# Patient Record
Sex: Male | Born: 1935
Health system: Southern US, Community
[De-identification: ages and names within clinical notes are randomized; demographics above are authoritative.]

## PROBLEM LIST (undated history)

## (undated) DIAGNOSIS — I2510 Atherosclerotic heart disease of native coronary artery without angina pectoris: Secondary | ICD-10-CM

## (undated) DIAGNOSIS — Z87898 Personal history of other specified conditions: Secondary | ICD-10-CM

## (undated) DIAGNOSIS — Z8679 Personal history of other diseases of the circulatory system: Secondary | ICD-10-CM

## (undated) DIAGNOSIS — R011 Cardiac murmur, unspecified: Secondary | ICD-10-CM

## (undated) DIAGNOSIS — I350 Nonrheumatic aortic (valve) stenosis: Secondary | ICD-10-CM

## (undated) DIAGNOSIS — N200 Calculus of kidney: Secondary | ICD-10-CM

## (undated) DIAGNOSIS — I10 Essential (primary) hypertension: Secondary | ICD-10-CM

## (undated) DIAGNOSIS — N189 Chronic kidney disease, unspecified: Secondary | ICD-10-CM

## (undated) DIAGNOSIS — N201 Calculus of ureter: Secondary | ICD-10-CM

## (undated) DIAGNOSIS — Z9289 Personal history of other medical treatment: Secondary | ICD-10-CM

## (undated) DIAGNOSIS — M069 Rheumatoid arthritis, unspecified: Secondary | ICD-10-CM

## (undated) DIAGNOSIS — Z8709 Personal history of other diseases of the respiratory system: Secondary | ICD-10-CM

## (undated) DIAGNOSIS — I6523 Occlusion and stenosis of bilateral carotid arteries: Secondary | ICD-10-CM

## (undated) DIAGNOSIS — E039 Hypothyroidism, unspecified: Secondary | ICD-10-CM

## (undated) DIAGNOSIS — I739 Peripheral vascular disease, unspecified: Secondary | ICD-10-CM

## (undated) HISTORY — PX: COLONOSCOPY: SHX174

## (undated) HISTORY — DX: Chronic kidney disease, unspecified: N18.9

## (undated) HISTORY — PX: TENDON REPAIR: SHX5111

## (undated) HISTORY — DX: Atherosclerotic heart disease of native coronary artery without angina pectoris: I25.10

## (undated) HISTORY — DX: Essential (primary) hypertension: I10

---

## 1971-03-27 HISTORY — PX: LUMBAR DISC SURGERY: SHX700

## 1983-03-27 HISTORY — PX: ANAL FISSURE REPAIR: SHX2312

## 1998-12-23 ENCOUNTER — Encounter: Payer: Self-pay | Admitting: Emergency Medicine

## 1998-12-23 ENCOUNTER — Emergency Department (HOSPITAL_COMMUNITY): Admission: EM | Admit: 1998-12-23 | Discharge: 1998-12-23 | Payer: Self-pay | Admitting: Emergency Medicine

## 2006-03-15 ENCOUNTER — Encounter (INDEPENDENT_AMBULATORY_CARE_PROVIDER_SITE_OTHER): Payer: Self-pay | Admitting: *Deleted

## 2006-03-15 ENCOUNTER — Ambulatory Visit (HOSPITAL_BASED_OUTPATIENT_CLINIC_OR_DEPARTMENT_OTHER): Admission: RE | Admit: 2006-03-15 | Discharge: 2006-03-15 | Payer: Self-pay | Admitting: Orthopedic Surgery

## 2006-03-15 HISTORY — PX: OTHER SURGICAL HISTORY: SHX169

## 2006-11-19 ENCOUNTER — Ambulatory Visit: Payer: Self-pay | Admitting: Cardiology

## 2006-11-19 ENCOUNTER — Inpatient Hospital Stay (HOSPITAL_COMMUNITY): Admission: EM | Admit: 2006-11-19 | Discharge: 2006-11-21 | Payer: Self-pay | Admitting: Emergency Medicine

## 2006-11-19 ENCOUNTER — Ambulatory Visit: Payer: Self-pay | Admitting: Pulmonary Disease

## 2006-11-29 ENCOUNTER — Ambulatory Visit: Payer: Self-pay

## 2006-11-29 ENCOUNTER — Encounter: Payer: Self-pay | Admitting: Cardiology

## 2007-01-08 ENCOUNTER — Ambulatory Visit: Payer: Self-pay | Admitting: Cardiology

## 2008-12-03 ENCOUNTER — Encounter: Payer: Self-pay | Admitting: Internal Medicine

## 2008-12-04 DIAGNOSIS — R55 Syncope and collapse: Secondary | ICD-10-CM | POA: Insufficient documentation

## 2008-12-04 DIAGNOSIS — I10 Essential (primary) hypertension: Secondary | ICD-10-CM

## 2008-12-04 DIAGNOSIS — J930 Spontaneous tension pneumothorax: Secondary | ICD-10-CM

## 2008-12-04 DIAGNOSIS — J939 Pneumothorax, unspecified: Secondary | ICD-10-CM | POA: Insufficient documentation

## 2008-12-06 ENCOUNTER — Encounter: Payer: Self-pay | Admitting: Internal Medicine

## 2008-12-07 ENCOUNTER — Encounter: Payer: Self-pay | Admitting: Internal Medicine

## 2008-12-16 ENCOUNTER — Ambulatory Visit: Payer: Self-pay | Admitting: Internal Medicine

## 2008-12-16 ENCOUNTER — Encounter (INDEPENDENT_AMBULATORY_CARE_PROVIDER_SITE_OTHER): Payer: Self-pay | Admitting: *Deleted

## 2008-12-16 DIAGNOSIS — R002 Palpitations: Secondary | ICD-10-CM | POA: Insufficient documentation

## 2010-05-18 ENCOUNTER — Ambulatory Visit (HOSPITAL_COMMUNITY)
Admission: RE | Admit: 2010-05-18 | Discharge: 2010-05-18 | Disposition: A | Discharge status: Home / Self Care | Payer: Medicare Other | Source: Ambulatory Visit | Attending: Orthopedic Surgery | Admitting: Orthopedic Surgery

## 2010-05-18 ENCOUNTER — Ambulatory Visit (HOSPITAL_BASED_OUTPATIENT_CLINIC_OR_DEPARTMENT_OTHER)
Admission: RE | Admit: 2010-05-18 | Discharge: 2010-05-19 | Disposition: A | Discharge status: Home / Self Care | Payer: Medicare Other | Source: Ambulatory Visit | Attending: Orthopedic Surgery | Admitting: Orthopedic Surgery

## 2010-05-18 DIAGNOSIS — M2240 Chondromalacia patellae, unspecified knee: Secondary | ICD-10-CM | POA: Insufficient documentation

## 2010-05-18 DIAGNOSIS — M23305 Other meniscus derangements, unspecified medial meniscus, unspecified knee: Secondary | ICD-10-CM | POA: Insufficient documentation

## 2010-05-18 HISTORY — PX: KNEE ARTHROSCOPY W/ MENISCECTOMY: SHX1879

## 2010-05-19 NOTE — Op Note (Signed)
  NAME:  Joseph Potts, Joseph Potts NO.:  [id number]  MEDICAL RECORD NO.:  [id number]           PATIENT TYPE:  O  LOCATION:  DAYL                         FACILITY:  [facility]  PHYSICIAN:  Marlowe Kays, M.D.  DATE OF BIRTH:  03-Feb-1933  DATE OF PROCEDURE:  05/18/2010 DATE OF DISCHARGE:  05/18/2010                              OPERATIVE REPORT   PREOPERATIVE DIAGNOSIS:  Torn medial meniscus, left knee.  POSTOPERATIVE DIAGNOSIS:  Torn medial meniscus, left knee.  OPERATION:  Left knee arthroscopy with partial medial meniscectomy.  SURGEON:  Marlowe Kays, M.D.  ASSISTANT:  Nurse.  ANESTHESIA:  General.  PATHOLOGICAL JUSTIFICATION FOR PROCEDURE:  Painful inner aspect of his left knee.  An MRI was performed, which demonstrated a badly-comminuted tear with fragments interposed in the joint.  This was exactly what we found at surgery.  He also had some grade 2/4 chondromalacia of the medial femoral condyle in the area where the meniscus was entrapped. The remainder of his joint looked relatively normal.  DESCRIPTION OF PROCEDURE:  Satisfactory general anesthesia, Ace wrap, and knee support to the right lower extremity, pneumatic tourniquet to left lower extremity with left leg Esmarched out non-sterilely and tourniquet inflated to 25 mmHg.  Thigh stabilizer applied.  Left leg was then prepped with DuraPrep and stabilized, ankle draped in sterile field.  Time-out performed.  Superior medial saline inflow.  First an anterolateral portal medial compartment joint was evaluated with the findings discussed in the pathology.  With small baskets, I began resecting the badly mangled fragment and then smoothed down the remaining rim with a 3.5 shaver.  Grade 2/4 chondromalacia of the medial femoral condyle required minimal shaving.  ACL was intact.  Looking at the medial gutter and suprapatellar area, he had some minimal wear of the patella, which did not require shaving.  I then  reversed portals. The lateral compartment knee joint looked entirely normal.  Final pictures were taken.  The knee joint was then irrigated of all material and fluid.  I then closed the two entry portals with 4-0 nylon and injected through the inflow apparatus 20 mL of 0.5% Marcaine with adrenaline and 4 mg of morphine, and then closed this portal with 4-0 nylon as well. Betadine, Adaptic dry sterile dressing were applied.  Tourniquet was released.  At the time of this dictation, he was on his way to recovery room in satisfactory condition with no known complications.          ______________________________ Marlowe Kays, M.D.     JA/MEDQ  D:  05/18/2010  T:  05/19/2010  Job:  [id number]  Electronically Signed by Marlowe Kays M.D. on 05/19/2010 04:26:59 PM

## 2010-08-08 NOTE — Discharge Summary (Signed)
NAME:  Joseph Potts, Joseph Potts NO.:  [id number]   MEDICAL RECORD NO.:  [id number]          PATIENT TYPE:  INP   LOCATION:  3739                         FACILITY:  [facility]   PHYSICIAN:  Coralyn Helling, MD        DATE OF BIRTH:  1932/10/31   DATE OF ADMISSION:  11/19/2006  DATE OF DISCHARGE:  11/21/2006                               DISCHARGE SUMMARY   DISCHARGE DIAGNOSES:  1. Right spontaneous pneumothorax, listed at 25% on admission.  2. Syncope of unknown origin.  3. Increased blood pressure.  4. Pain.   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  He was in his usual state  of good health.  He was involved in a typical exercise regimen on November 19, 2006.  He noticed during exercise that he started to become dizzy  and light-headedness and passed out.  His wife quickly woke him up.  There is no history of confusion after this event.  There was also no  history of loss of bowel or bladder control or significant injury.  He  states that, after the episode, he felt he had a discomfort in his chest  and was feeling short of breath.  For that reason, he was taken to the  [facility] Emergency Department for further evaluation and treatment  after being evaluated by Dr. Rudi Heap.  A chest x-ray at Dr. Kathi Der  office showed a 25% right pneumothorax.  He was transferred to [facility] and underwent right tube thoracostomy per Dr. Coralyn Helling.   LABORATORY DATA:  Hemoglobin 14.5, hematocrit 42.9, platelets of 184,  WBC was 13.3.  Sodium 137, potassium 5, chloride 101, CO2 30, BUN 13,  creatinine 0.76, glucose 101.  Albumin was 3.8, troponin I 0.2, CK-MB  was 1.7, CK was 155.  Calcium was 9.2, magnesium was 2.2, TSH was 1.322,  folate was greater than 20.  Vitamin B12 was 445.   RADIOGRAPHIC DATA:  Chest x-ray on August 26 demonstrates a right chest  tube in place and positional pneumothorax  was identified.  Chest x-ray  on August 28, after removal of chest tube, shows no pneumothorax.  CT of  the head on August 27 showed no acute abnormalities, within normal  limits for age.   HOSPITAL COURSE BY DISCHARGE DIAGNOSES:  1. Right spontaneous pneumothorax of 25%.  Chest x-ray, done at [facility] office, demonstrated a pneumothorax of 25%.  He had a right      tube thoracostomy, #28 [place], placed by Dr. Coralyn Helling on November 19, 2006, with reduction of the pneumothorax to zero.  He had the      chest tube removed on November 21, 2006.  Chest x-ray four hours      later demonstrated no pneumothorax and he was ready for discharge      home.  He has been instructed to follow up with the pulmonary      critical care division  at [facility] on September 2 for      evaluation of chest x-ray and removal of sutures.  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  He has alluded to the fact that he probably will      not make this appointment.  Therefore, it is suggested that, should      he decline to follow up on his appointment with pulmonary critical      care, that he go to Dr. Rudi Heap and have a PA and lateral      chest x-ray done to make sure that the pneumothorax has not      spontaneously recurred and also have the sutures removed.  He has      been instructed, should he have any problems, to call the pulmonary      critical care office for further evaluation and treatment.  2. Syncope:  His ECG was unremarkable.  CT scan was unremarkable.  His      cardiac enzymes were unremarkable.  He will follow up with Dr. Angelina Sheriff in one week for further evaluation and treatment that will      include a 2D echo.  3. Increased blood pressure:  Note that his blood pressure has trended      down and now is 136/72 with a heart rate of 53.  Again, this will      be evaluated by cardiology.  4.  Pain:  He was treated with IV analgesics and his pain has now      resolved and, once the chest tube removed, he had no further pain.   DISCHARGE MEDICATIONS:  He takes home vitamins and fish oil.  He is on  no prescription medications.   FOLLOWUP:  He has a followup appointment with Rubye Oaks, Nurse  Practitioner, on September 2, at 10:30 a.m.  He also has a followup  appointment with Dr. Antoine Poche as scheduled.   DIET:  Heart-healthy, low-sodium diet.   DISPOSITION/CONDITION ON DISCHARGE:  His 24% right pneumothorax has been  resolved with chest tube insertion and removal.  He is being discharged  home in improved condition.      Devra Dopp, MSN, ACNP      Coralyn Helling, MD  Electronically Signed    SM/MEDQ  D:  11/21/2006  T:  11/21/2006  Job:  [id number]   cc:   Rollene Rotunda, MD, [facility]  Ernestina Penna, M.D.

## 2010-08-08 NOTE — H&P (Signed)
NAME:  Joseph Potts, Joseph Potts NO.:  [id number]   MEDICAL RECORD NO.:  [id number]          PATIENT TYPE:  INP   LOCATION:  3702                         FACILITY:  [facility]   PHYSICIAN:  Joseph Helling, MD        DATE OF BIRTH:  1932-07-14   DATE OF ADMISSION:  11/19/2006  DATE OF DISCHARGE:                              HISTORY & PHYSICAL   ADMISSION DIAGNOSES:  1. Syncope.  2. Right pneumothorax.   Joseph Potts is a 77 year old male who has no significant past medical  history.  He was in his usual state of health. He was involved in his  typical exercise regimen earlier this morning. He said he had gone out  for a job, and then he came home and used his [employer] and was doing  exercises on that. He then had taken a shower and, while he was walking  in the kitchen, he says he started to become dizzy and lightheaded and  passed out. His wife heard this and immediately came to see him.  He  quickly woke up, and there is no history of confusion after this event.  There is also no history of loss of bowel or bladder control or  significant injury. He says that after this he felt that he had a  discomfort in his chest and was feeling short of breath.  He denies any  chest pressure or chest heaviness.  He said that it would hurt when he  would take a deep breath in.  He denies any palpitations, nausea,  vomiting or abdominal pain.  He also denies any recent fevers, sweats or  flu-like symptoms.  He has not been having any problems coughing.  He  was seen Joseph Potts's office early this morning. At that time he  had a chest x-ray done which showed approximately 25% right pneumothorax  but no evidence for rib fractures. EKG at that time also showed normal  sinus rhythm, and he was referred to [facility] emergency room for  further evaluation of syncope and his pneumothorax.   PAST MEDICAL HISTORY:  Unremarkable.   MEDICATIONS:  He takes several vitamins.   ALLERGIES:  He has an  allergy to IBUPROFEN which he says causes him to  develop swelling in his lips.   FAMILY HISTORY:  Is not significant.   SOCIAL HISTORY:  He is married.  He works as a Education officer, community.  There is no  history of tobacco abuse.  He has occasional alcohol use.   REVIEW OF SYSTEMS:  Unremarkable except for as stated above.   PHYSICAL EXAMINATION:  GENERAL:  He is seen in the emergency room.  He  is awake, alert and oriented. Did not appear to be in any acute  distress.  VITAL SIGNS:  Blood pressure is 154/87, heart rate 65 and regular,  respiratory rate  26, oxygen saturation  99% on 2 liters nasal cannula.  HEENT:  Pupils reactive.  Extraocular muscles intact.  He has a small  lump on the posterior aspect of his head.  There is no other signs of  trauma. Tongue is midline.  He has normal speech.  NECK:  There is no lymphadenopathy or jugular venous distention.  No  thyromegaly.  HEART:  S1-S2.  Regular rate and rhythm.  CHEST:  He had decreased breath sounds on the right. There is no  wheezing or rales.  ABDOMEN:  Thin, soft, nontender.  Positive bowel sounds.  EXTREMITIES:  There is no edema, cyanosis, clubbing.  NEUROLOGIC:  Cranial nerves II-XII are intact.  He has 5/5 strength and  normal sensation.   Chest x-ray as stated above.   Laboratory tests are pending.   EKG as stated above.   IMPRESSION:  1. Right-sided 25% pneumothorax. Given the patient's complaints of      dyspnea as well as the size of the pneumothorax, I believe that it      would be necessary to have chest tube drainage, and we will proceed      with chest tube insertion and follow up on his chest x-ray.  2. Syncope. I am not sure what the cause of this is.  I will monitor      him on telemetry.  I will follow up on his EKG as well as his      cardiac enzymes, and we will ask for further assistance from      cardiology.  3. Pain control.  I will continue him on oral and intravenous      analgesia as needed.  4.  Allergy to MOTRIN.      Joseph Helling, MD  Electronically Signed     VS/MEDQ  D:  11/19/2006  T:  11/19/2006  Job:  [id number]

## 2010-08-08 NOTE — Assessment & Plan Note (Signed)
[facility]                            CARDIOLOGY OFFICE NOTE   Shermaine, Brigham CHALMERS IDDINGS                      MRN:          [id number]  DATE:01/08/2007                            DOB:          [birth year]    PRIMARY:  Dr. Vernon Prey.   REASON FOR PRESENTATION:  Evaluate the patient for syncope.   HISTORY OF PRESENT ILLNESS:  The patient was hospitalized in August with  an episode of syncope.  He was also found to have a spontaneous  pneumothorax of 25% requiring a chest tube.  He was seen in consultation  by Dr. Juanda Chance.  The etiology of his syncope was felt to be probably  vagal related to the pneumothorax.  He did have an outpatient  echocardiogram, which was positive only for some mild aortic sclerosis.  He did rule out for myocardial infarction.   He has since been quite active.  He jogs.  He does other physical  activity and denies any chest discomfort, neck or arm discomfort.  He  has had no palpitations, pre-syncope, or syncope.  He has had no PND or  orthopnea.   PAST MEDICAL HISTORY:  Back surgery in 1973.  Anal fissure repair in  1985.  History of a decreased HDL.  Spontaneous pneumothorax as above.   ALLERGIES:  IBUPROFEN.   MEDICATIONS:  1. Fish oil 4000 mg daily.  2. Vitamin C.  3. Vitamin E.  4. Aloe vera.  5. Coenzyme Q.  6. L-Arginine.  7. Lycine.  8. Magnesium.   REVIEW OF SYSTEMS:  As stated in the HPI and otherwise negative for  other systems.   PHYSICAL EXAM:  The patient is in no distress.  Blood pressure 140/78, heart rate 64 and regular, weight 190 pounds,  body mass index 25.  HEENT:  Eyelids unremarkable.  Pupils are equal, round, and reactive to  light and accommodation.  Fundi are not visualized.  Oral mucosa  unremarkable.  NECK:  No jugular venous distension at 45 degrees, carotid upstroke  brisk and symmetric, no bruits, thyromegaly.  LYMPHATICS:  No cervical, axillary, or inguinal adenopathy.  LUNGS:  Clear  to auscultation bilaterally.  BACK:  No costovertebral angle tenderness.  CHEST:  Unremarkable.  HEART:  PMI not displaced or sustained, S1 and S2 within normal limits,  no S3, no S4, very slight systolic murmur heard right at the right upper  sternal border.  No diastolic murmurs.  ABDOMEN:  Flat, positive bowel sounds, normal in frequency and pitch, no  bruits, rebound, guarding.  No midline pulsatile masses, hepatomegaly,  splenomegaly.  SKIN:  No rashes, no nodules.  EXTREMITIES:  With 2+ pulses, no edema.  NEURO:  Oriented to person, place, and time, cranial nerves 2-12 grossly  intact, motor grossly intact.   ASSESSMENT AND PLAN:  1. Syncope.  This is very likely related to the pneumothorax.  He has      otherwise done quite well and has no cardiovascular symptoms.  At      this point, no further cardiovascular testing is suggested.  2. Hypertension.  His blood pressure  is borderline.  He will have this      followed by Dr. Christell Constant going forward.  3. Followup will be back in this clinic as needed.     Rollene Rotunda, MD, [facility]  Electronically Signed    JH/MedQ  DD: 01/08/2007  DT: 01/09/2007  Job #: [id number]   cc:   Ernestina Penna, M.D.

## 2010-08-08 NOTE — Consult Note (Signed)
NAME:  Joseph Potts, Joseph Potts NO.:  [id number]   MEDICAL RECORD NO.:  [id number]          PATIENT TYPE:  INP   LOCATION:  3702                         FACILITY:  [facility]   PHYSICIAN:  Everardo Beals. Juanda Chance, MD, FACCDATE OF BIRTH:  Jan 31, 1933   DATE OF CONSULTATION:  DATE OF DISCHARGE:                                 CONSULTATION   PRIMARY CARE PHYSICIAN:  Dr. Vernon Prey.   CARDIOLOGIST:  Dr. Angelina Sheriff.   PULMONOLOGIST:  Dr. Coralyn Helling.   REASON FOR CONSULTATION:  Evaluation of syncope.   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  He is very active and jogs  regularly and works out with weight.  He jogged and works out with  weights this morning and then took a shower.  After he got out of the  shower, he became real lightheaded and then fell to the floor.  His wife  was in the next room, heard the thump and came in to see him.  She found  him on the floor and shook him, and opened his eyes and responded.  Shortly after that, he developed some mild right-sided chest pain with  inspiration.  He is worried this might be cardiac and went to Dr.  Kathi Der office.  Dr. Christell Constant did a chest x-ray which showed a right  pneumothorax and called Dr. Daleen Squibb and referred him to [facility]  Emergency Department.  He was seen in the emergency room by Dr. Coralyn Helling, and a and chest tube was placed, and we were asked to consult him  regarding his syncope.   Dr. Melvyn Neth had been evaluated four years ago by Dr. Antoine Poche, with a  stress Myoview scan which showed some ST changes and some questionable  slight inferior attenuation, with possible mild ischemia.  This was felt  to be low risk, and he did not have any further evaluation.  He has had  no recent chest pain, shortness of breath or palpitations.  He has had a  remote history of palpitations.  He has had no previous syncope.   PAST MEDICAL HISTORY:  Significant for  back surgery in 1973.  He had  anal fissure repair in 1985.  Also, has a history of decreased HDL.   MEDICATIONS:  None.   ALLERGIES:  IBUPROFEN.   SOCIAL HISTORY:  Lives in [place] with his wife.  He has five children,  does not smoke.  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   FAMILY HISTORY:  Negative for cardiac disease.  His mother died at 21 of  stroke, and his father died at 43 of a stroke.   REVIEW OF SYSTEMS:  Is negative.   PHYSICAL EXAMINATION:  VITAL SIGNS:  Blood pressure 147/74 and the pulse  58 and regular.  NECK:  There was no venous distention.  The carotid pulses were full,  and there were no bruits.  CHEST:  Clear.  The chest had decreased breath sounds on the right side,  prior to chest tube placement.  The left side was clear without rales or  rhonchi.  CARDIAC:  Rhythm was regular.  Heart sounds were normoactive.  No  murmurs or gallops.  ABDOMEN:  Soft with normal bowel sounds.  There is no  hepatosplenomegaly.  Peripheral pulses were full.  There is no  peripheral edema.  MUSCULOSKELETAL:  Exam showed no deformities.  SKIN:  Warm and dry.  NEUROLOGIC:  Examination showed no focal neurological signs.   IMPRESSION:  1. Syncope of uncertain etiology.  2. Right pneumothorax status post chest tube placement.  3. Low HDL.   RECOMMENDATIONS:  I think most probably, Joseph Potts syncope was a  vasovagal syncope, related to post-exercise vaso-dilatation and  showering.  Concerning feature is that he has done this before, without  similar symptoms.  Will plan to watch him on the monitor.  Will plan to  get a 2-D echo on him, today or tomorrow.  If all his markers are  negative and we do not see any arrhythmias on monitoring and if echo is  normal, we will plan an outpatient stress test and Myoview scan.  Anticipation is that he will need to be in two days with his chest tube.      Bruce Elvera Lennox Juanda Chance, MD, [facility]  Electronically  Signed     BRB/MEDQ  D:  11/19/2006  T:  11/20/2006  Job:  [id number]

## 2010-08-08 NOTE — Op Note (Signed)
NAME:  Joseph Potts, DICIOCCIO NO.:  [id number]   MEDICAL RECORD NO.:  [id number]          PATIENT TYPE:  EMS   LOCATION:  MAJO                         FACILITY:  [facility]   PHYSICIAN:  Coralyn Helling, MD        DATE OF BIRTH:  02/24/1933   DATE OF PROCEDURE:  11/19/2006  DATE OF DISCHARGE:                               OPERATIVE REPORT   PROCEDURE:  Right chest tube thoracotomy.   PREOPERATIVE DIAGNOSIS:  Right sided pneumothorax.   POSTOPERATIVE DIAGNOSIS:  Right sided pneumothorax.   INDICATIONS:  [name] is a 77 year old male who had an episode of  syncope earlier in the day and he sustained a 25% right pneumothorax.  He is to undergo chest tube insertion for drainage of the right  pneumothorax.  The procedure was explained to the patient.   DESCRIPTION OF PROCEDURE:  The procedure was done in the emergency room.  He was given a total of 2 mg of Versed and 50 mcg of fentanyl  intravenously for sedation and analgesia.  An incision was made at the  right fourth rib space in the mid axillary line after receiving 1%  lidocaine for topical anesthesia.  Blunt dissection was done down to the  pleural space, then the pleural space was punctured.  There was no  evidence of adhesions on finger sweep in the rib cage.  A 20 French  chest tube was inserted with good conization of the tubing and the chest  tube was connected to a Pleur-Evac with initial air leak which resolved.  The patient remained hemodynamically stable throughout the procedure.  Post procedure chest x-ray showed the chest tube in good position with  re-expansion of the right lung.      Coralyn Helling, MD  Electronically Signed     VS/MEDQ  D:  11/19/2006  T:  11/19/2006  Job:  [id number]

## 2010-08-11 NOTE — Op Note (Signed)
NAME:  [name], [name] NO.:  [id number]   MEDICAL RECORD NO.:  [id number]          PATIENT TYPE:  AMB   LOCATION:  DSC                          FACILITY:  [facility]   PHYSICIAN:  Katy Fitch. Sypher, M.D. DATE OF BIRTH:  09-24-1932   DATE OF PROCEDURE:  03/15/2006  DATE OF DISCHARGE:                               OPERATIVE REPORT   PREOPERATIVE DIAGNOSIS:  Enlarging skin lesion right long ring web space  now measuring approximately 6 x 8 mm.   POSTOPERATIVE DIAGNOSIS:  Enlarging skin lesion right long ring web  space now measuring approximately 6 x 8 mm pending histopathologic  evaluation of biopsy specimen.   OPERATION:  Full thickness skin resection, right long ring finger dorsal  web.   SURGEON:  Katy Fitch. Sypher, M.D.   ASSISTANT:  Marveen Reeks. Dasnoit, P.A.-C.   ANESTHESIA:  2% lidocaine local block of right long ring finger web.   ANESTHETIST:  Dr. 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  He had noted enlargement of an area of friable skin over the  course of approximately six months.  He was concerned about the  appearance of the mass and requested a full thickness skin biopsy.  The  differential diagnosis includes dermatofibroma or perhaps, a well  differentiated squamous cell carcinoma.  After informed consent, he is  brought to the operating room at this time anticipating full thickness  skin biopsy.   PROCEDURE:  Kais Monje is brought to the operating room and placed in  the supine position on the operating table.  Following routine Betadine  prep, a 2% lidocaine block was placed between the long and ring finger  web space.  The arm was then prepped with Betadine soap solution and  sterilely draped.  A pneumatic tourniquet was applied to the proximal  right forearm.  After exsanguination of the right hand, wrist and  forearm, the  arterial tourniquet was inflated to 280 mmHg.  The  procedure commenced with an elliptical excision of skin with a 1 mm  margin.  The biopsy specimen was approximately 1 cm in length and  approximately 8 mm in width.  This was taken full thickness down to the  subcutaneous fat.  Care was taken to spare the sensory nerves and veins.  The wound was then repaired with an intradermal 4-0 Prolene suture.  The  web was dressed with Xeroflow sterile gauze and a Coban dressing.   I have advised Dr. Melvyn Neth to remove his dressing in 48 hours.  At that  time, he may dress the wound with Neosporin.  He will return for follow-  up in one week.  He understands he should keep his wound clean and dry  for seven days.  For aftercare, he will use over-the-counter analgesic  medication.  No prophylactic antibiotics were provided.  He will contact  us should he have any difficulties with his wound.      Katy Fitch Sypher, M.D.  Electronically Signed     RVS/MEDQ  D:  03/15/2006  T:  03/16/2006  Job:  [id number]   cc:   Ernestina Penna, M.D.

## 2011-01-05 LAB — COMPREHENSIVE METABOLIC PANEL
ALT: 19
AST: 22
Alkaline Phosphatase: 54
CO2: 30
Calcium: 9.2
Chloride: 101
GFR calc non Af Amer: >60
Glucose, Bld: 101 — ABNORMAL HIGH
Potassium: 5
Sodium: 137

## 2011-01-05 LAB — URINALYSIS, ROUTINE W REFLEX MICROSCOPIC
Ketones, ur: 15 — AB
Nitrite: NEGATIVE
Protein, ur: NEGATIVE
pH: 7

## 2011-01-05 LAB — CARDIAC PANEL(CRET KIN+CKTOT+MB+TROPI)
CK, MB: 1.7
Relative Index: 1.1

## 2011-01-05 LAB — CK TOTAL AND CKMB (NOT AT ARMC)
CK, MB: 2.1
Total CK: 80

## 2011-01-05 LAB — MAGNESIUM: Magnesium: 2.2

## 2011-01-05 LAB — VITAMIN B12: Vitamin B-12: 445 (ref 211–911)

## 2011-01-05 LAB — CBC
Platelets: 184
RDW: 12.9

## 2011-01-05 LAB — DIFFERENTIAL
Basophils Relative: 0
Eosinophils Absolute: 0.1
Eosinophils Relative: 1
Lymphs Abs: 1.2

## 2011-01-05 LAB — TSH: TSH: 1.322

## 2011-01-05 LAB — TROPONIN I: Troponin I: 0.03

## 2011-05-04 DIAGNOSIS — I781 Nevus, non-neoplastic: Secondary | ICD-10-CM | POA: Diagnosis not present

## 2011-05-04 DIAGNOSIS — L819 Disorder of pigmentation, unspecified: Secondary | ICD-10-CM | POA: Diagnosis not present

## 2011-05-04 DIAGNOSIS — L570 Actinic keratosis: Secondary | ICD-10-CM | POA: Diagnosis not present

## 2011-06-15 DIAGNOSIS — L819 Disorder of pigmentation, unspecified: Secondary | ICD-10-CM | POA: Diagnosis not present

## 2011-09-04 ENCOUNTER — Encounter: Payer: Self-pay | Admitting: Cardiovascular Disease

## 2011-09-04 ENCOUNTER — Ambulatory Visit (INDEPENDENT_AMBULATORY_CARE_PROVIDER_SITE_OTHER): Payer: Medicare Other | Admitting: Cardiovascular Disease

## 2011-09-04 VITALS — BP 110/64 | HR 58 | Ht 72.0 in | Wt 187.4 lb

## 2011-09-04 DIAGNOSIS — E039 Hypothyroidism, unspecified: Secondary | ICD-10-CM | POA: Diagnosis not present

## 2011-09-04 DIAGNOSIS — E559 Vitamin D deficiency, unspecified: Secondary | ICD-10-CM | POA: Diagnosis not present

## 2011-09-04 DIAGNOSIS — I10 Essential (primary) hypertension: Secondary | ICD-10-CM | POA: Diagnosis not present

## 2011-09-04 DIAGNOSIS — Z125 Encounter for screening for malignant neoplasm of prostate: Secondary | ICD-10-CM | POA: Diagnosis not present

## 2011-09-04 DIAGNOSIS — R55 Syncope and collapse: Secondary | ICD-10-CM

## 2011-09-04 DIAGNOSIS — E291 Testicular hypofunction: Secondary | ICD-10-CM | POA: Diagnosis not present

## 2011-09-04 DIAGNOSIS — E785 Hyperlipidemia, unspecified: Secondary | ICD-10-CM | POA: Diagnosis not present

## 2011-09-04 NOTE — Patient Instructions (Signed)
Your physician recommends that you schedule a follow-up appointment in: 3 weeks with Dr. Antoine Poche in [facility]  Your physician has requested that you have an echocardiogram. Echocardiography is a painless test that uses sound waves to create images of your heart. It provides your doctor with information about the size and shape of your heart and how well your heart's chambers and valves are working. This procedure takes approximately one hour. There are no restrictions for this procedure.   Your physician has requested that you have a carotid duplex. This test is an ultrasound of the carotid arteries in your neck. It looks at blood flow through these arteries that supply the brain with blood. Allow one hour for this exam. There are no restrictions or special instructions.

## 2011-09-04 NOTE — Assessment & Plan Note (Signed)
Episode of syncope after extreme exertion while walking 18 holes of golf in the heat. Most likely vasovagal or secondary to dehydration. No recurrence. Will arrange echo to assess LV function and carotid dopplers to exclude carotid stenosis. I do not think this is related to ischemia. He had no palpitations so likely not related to an arrythmia.  He will follow up with Dr. Antoine Poche in [place].
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
## 2011-09-05 ENCOUNTER — Encounter (INDEPENDENT_AMBULATORY_CARE_PROVIDER_SITE_OTHER): Payer: Medicare Other

## 2011-09-05 DIAGNOSIS — I6529 Occlusion and stenosis of unspecified carotid artery: Secondary | ICD-10-CM

## 2011-09-05 DIAGNOSIS — R55 Syncope and collapse: Secondary | ICD-10-CM | POA: Diagnosis not present

## 2011-09-06 ENCOUNTER — Ambulatory Visit (HOSPITAL_COMMUNITY): Payer: Medicare Other | Attending: Cardiology

## 2011-09-06 DIAGNOSIS — R55 Syncope and collapse: Secondary | ICD-10-CM | POA: Diagnosis not present

## 2011-09-06 DIAGNOSIS — I359 Nonrheumatic aortic valve disorder, unspecified: Secondary | ICD-10-CM | POA: Insufficient documentation

## 2011-09-06 DIAGNOSIS — I4729 Other ventricular tachycardia: Secondary | ICD-10-CM | POA: Insufficient documentation

## 2011-09-06 DIAGNOSIS — I472 Ventricular tachycardia, unspecified: Secondary | ICD-10-CM | POA: Insufficient documentation

## 2011-09-06 HISTORY — PX: TRANSTHORACIC ECHOCARDIOGRAM: SHX275

## 2011-09-06 NOTE — Progress Notes (Signed)
Echocardiogram performed.  

## 2011-09-07 DIAGNOSIS — Z1212 Encounter for screening for malignant neoplasm of rectum: Secondary | ICD-10-CM | POA: Diagnosis not present

## 2011-09-24 ENCOUNTER — Telehealth: Payer: Self-pay | Admitting: *Deleted

## 2011-09-26 ENCOUNTER — Ambulatory Visit (INDEPENDENT_AMBULATORY_CARE_PROVIDER_SITE_OTHER): Payer: Medicare Other | Admitting: Cardiology

## 2011-09-26 ENCOUNTER — Encounter: Payer: Self-pay | Admitting: Cardiology

## 2011-09-26 VITALS — BP 140/70 | HR 61 | Ht 72.0 in | Wt 191.0 lb

## 2011-09-26 DIAGNOSIS — R55 Syncope and collapse: Secondary | ICD-10-CM

## 2011-09-26 DIAGNOSIS — R609 Edema, unspecified: Secondary | ICD-10-CM | POA: Insufficient documentation

## 2011-09-26 DIAGNOSIS — I10 Essential (primary) hypertension: Secondary | ICD-10-CM

## 2011-09-26 NOTE — Assessment & Plan Note (Signed)
He does have edema and I suggested compression stockings when he is at work on his feet.

## 2011-09-26 NOTE — Assessment & Plan Note (Signed)
The blood pressure is at target. No change in medications is indicated. We will continue with therapeutic lifestyle changes (TLC).  

## 2011-09-26 NOTE — Patient Instructions (Addendum)
The current medical regimen is effective;  continue present plan and medications.  Follow up as needed 

## 2011-09-26 NOTE — Assessment & Plan Note (Signed)
This is probably related to the circumstances of being overheated and under hydrated. No further workup is suggested. Let us know if this happens again.

## 2011-09-26 NOTE — Progress Notes (Signed)
   HPI The patient presents for followup of syncope. This happened as described by Dr. Tempie Donning.  Since that event she's had no further episodes. He denies any palpitations, presyncope or syncope.  He walks routinely and he has no chest pain or SOB.  He did have a carotid with bilateral non obstructive stenosis.  Echo demonstrated a well preserved EF with mild LVH.    Allergies  Allergen Reactions  . Ibuprofen     Current Outpatient Prescriptions  Medication Sig Dispense Refill  . Cholecalciferol (VITAMIN D3) 5000 UNITS TABS Take 5,000 Units by mouth daily.      Marland Kitchen co-enzyme Q-10 30 MG capsule Take 30 mg by mouth daily.      . fish oil-omega-3 fatty acids 1000 MG capsule Take 4 g by mouth daily.      Marland Kitchen L-Arginine 1000 MG TABS Take 1,000 mg by mouth daily.      . magnesium gluconate (MAGONATE) 500 MG tablet Take 500 mg by mouth daily.      . niacinamide 500 MG tablet Take 500 mg by mouth daily.      Marland Kitchen selenium 50 MCG TABS Take 200 mcg by mouth daily.      Marland Kitchen VIT B6-VIT B12-OMEGA 3 ACIDS PO Take 100 Units by mouth daily.      . vitamin C (ASCORBIC ACID) 500 MG tablet Take 1,000 mg by mouth daily.        Past Medical History  Diagnosis Date  . Syncope   . SVT (supraventricular tachycardia)   . Spontaneous pneumothorax   . Hypertension     Past Surgical History  Procedure Date  . Back surgery     ROS:  As stated in the HPI and negative for all other systems.  PHYSICAL EXAM BP 140/70  Pulse 61  Ht 6' (1.829 m)  Wt 191 lb (86.637 kg)  BMI 25.90 kg/m2 GENERAL:  Well appearing HEENT:  Pupils equal round and reactive, fundi not visualized, oral mucosa unremarkable NECK:  No jugular venous distention, waveform within normal limits, carotid upstroke brisk and symmetric, no bruits, no thyromegaly LYMPHATICS:  No cervical, inguinal adenopathy LUNGS:  Clear to auscultation bilaterally BACK:  No CVA tenderness CHEST:  Unremarkable HEART:  PMI not displaced or sustained,S1 and S2  within normal limits, no S3, no S4, no clicks, no rubs, soft apical systolic murmur early peaking.  No diastolic murmur. ABD:  Flat, positive bowel sounds normal in frequency in pitch, no bruits, no rebound, no guarding, no midline pulsatile mass, no hepatomegaly, no splenomegaly EXT:  2 plus pulses throughout, moderate extremity edema, no cyanosis no clubbing   ASSESSMENT AND PLAN

## 2011-09-28 ENCOUNTER — Ambulatory Visit: Payer: BC Managed Care – PPO | Admitting: Cardiology

## 2011-10-04 ENCOUNTER — Encounter: Payer: Self-pay | Admitting: *Deleted

## 2011-11-20 DIAGNOSIS — H905 Unspecified sensorineural hearing loss: Secondary | ICD-10-CM | POA: Diagnosis not present

## 2012-02-25 DIAGNOSIS — N400 Enlarged prostate without lower urinary tract symptoms: Secondary | ICD-10-CM | POA: Diagnosis not present

## 2012-02-25 DIAGNOSIS — R072 Precordial pain: Secondary | ICD-10-CM | POA: Diagnosis not present

## 2012-02-25 DIAGNOSIS — Z0000 Encounter for general adult medical examination without abnormal findings: Secondary | ICD-10-CM | POA: Diagnosis not present

## 2012-03-28 DIAGNOSIS — N400 Enlarged prostate without lower urinary tract symptoms: Secondary | ICD-10-CM | POA: Diagnosis not present

## 2012-03-28 DIAGNOSIS — Z0000 Encounter for general adult medical examination without abnormal findings: Secondary | ICD-10-CM | POA: Diagnosis not present

## 2012-09-05 ENCOUNTER — Encounter (INDEPENDENT_AMBULATORY_CARE_PROVIDER_SITE_OTHER): Payer: Medicare Other

## 2012-09-05 DIAGNOSIS — I6529 Occlusion and stenosis of unspecified carotid artery: Secondary | ICD-10-CM

## 2012-11-06 ENCOUNTER — Other Ambulatory Visit: Payer: Self-pay | Admitting: *Deleted

## 2012-11-06 DIAGNOSIS — Z139 Encounter for screening, unspecified: Secondary | ICD-10-CM

## 2012-11-07 ENCOUNTER — Other Ambulatory Visit (INDEPENDENT_AMBULATORY_CARE_PROVIDER_SITE_OTHER): Payer: Medicare Other

## 2012-11-07 DIAGNOSIS — Z139 Encounter for screening, unspecified: Secondary | ICD-10-CM

## 2013-04-08 DIAGNOSIS — H2510 Age-related nuclear cataract, unspecified eye: Secondary | ICD-10-CM | POA: Diagnosis not present

## 2013-09-11 NOTE — Telephone Encounter (Signed)
completed

## 2014-02-01 ENCOUNTER — Other Ambulatory Visit: Payer: Self-pay | Admitting: *Deleted

## 2014-02-01 DIAGNOSIS — M25511 Pain in right shoulder: Secondary | ICD-10-CM

## 2014-02-02 ENCOUNTER — Other Ambulatory Visit: Payer: Self-pay | Admitting: Family Medicine

## 2014-02-02 ENCOUNTER — Other Ambulatory Visit (INDEPENDENT_AMBULATORY_CARE_PROVIDER_SITE_OTHER): Payer: Medicare Other

## 2014-02-02 DIAGNOSIS — R52 Pain, unspecified: Secondary | ICD-10-CM | POA: Diagnosis not present

## 2014-02-02 DIAGNOSIS — M25511 Pain in right shoulder: Secondary | ICD-10-CM

## 2014-02-03 ENCOUNTER — Other Ambulatory Visit: Payer: Self-pay | Admitting: *Deleted

## 2014-02-03 DIAGNOSIS — G629 Polyneuropathy, unspecified: Secondary | ICD-10-CM

## 2014-02-03 DIAGNOSIS — M542 Cervicalgia: Secondary | ICD-10-CM

## 2014-02-05 ENCOUNTER — Ambulatory Visit
Admission: RE | Admit: 2014-02-05 | Discharge: 2014-02-05 | Disposition: A | Discharge status: Home / Self Care | Payer: BC Managed Care – PPO | Source: Ambulatory Visit | Attending: Family Medicine | Admitting: Family Medicine

## 2014-02-05 DIAGNOSIS — M4802 Spinal stenosis, cervical region: Secondary | ICD-10-CM | POA: Diagnosis not present

## 2014-02-05 DIAGNOSIS — M47812 Spondylosis without myelopathy or radiculopathy, cervical region: Secondary | ICD-10-CM | POA: Diagnosis not present

## 2014-02-05 DIAGNOSIS — G629 Polyneuropathy, unspecified: Secondary | ICD-10-CM

## 2014-02-05 DIAGNOSIS — M5032 Other cervical disc degeneration, mid-cervical region: Secondary | ICD-10-CM | POA: Diagnosis not present

## 2014-02-05 DIAGNOSIS — M542 Cervicalgia: Secondary | ICD-10-CM

## 2014-02-05 DIAGNOSIS — M5022 Other cervical disc displacement, mid-cervical region: Secondary | ICD-10-CM | POA: Diagnosis not present

## 2014-02-08 ENCOUNTER — Other Ambulatory Visit: Payer: Self-pay | Admitting: *Deleted

## 2014-02-08 DIAGNOSIS — M542 Cervicalgia: Secondary | ICD-10-CM

## 2014-02-08 DIAGNOSIS — M4802 Spinal stenosis, cervical region: Secondary | ICD-10-CM

## 2014-02-08 DIAGNOSIS — G629 Polyneuropathy, unspecified: Secondary | ICD-10-CM

## 2014-02-11 DIAGNOSIS — Z6826 Body mass index (BMI) 26.0-26.9, adult: Secondary | ICD-10-CM | POA: Diagnosis not present

## 2014-02-11 DIAGNOSIS — M542 Cervicalgia: Secondary | ICD-10-CM | POA: Diagnosis not present

## 2014-02-11 DIAGNOSIS — M5022 Other cervical disc displacement, mid-cervical region: Secondary | ICD-10-CM | POA: Diagnosis not present

## 2014-02-11 DIAGNOSIS — R030 Elevated blood-pressure reading, without diagnosis of hypertension: Secondary | ICD-10-CM | POA: Diagnosis not present

## 2014-02-11 DIAGNOSIS — M5020 Other cervical disc displacement, unspecified cervical region: Secondary | ICD-10-CM | POA: Insufficient documentation

## 2014-02-12 ENCOUNTER — Other Ambulatory Visit: Payer: Self-pay | Admitting: Neurosurgery

## 2014-02-15 ENCOUNTER — Encounter (HOSPITAL_COMMUNITY): Payer: Self-pay | Admitting: *Deleted

## 2014-02-15 NOTE — H&P (Addendum)
Joseph Potts. is an 80 y.o. male.   Chief Complaint: right arm pain HPI: patient seen in my office last week because  Of pain and weakness of the right shoulder. 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 Had mri   Past Medical History  Diagnosis Date  . Syncope   . SVT (supraventricular tachycardia)   . Spontaneous pneumothorax   . Hypertension     Past Surgical History  Procedure Laterality Date  . Back surgery    . Thoracotomy  11/19/2006    right chest tube  . Skin resection  03/15/2006    right long finger dorsal web    Family History  Problem Relation Age of Onset  . Arrhythmia Mother   . Arrhythmia Father    Social History:  reports that he has never smoked. He does not have any smokeless tobacco history on file. He reports that he does not drink alcohol or use illicit drugs.  Allergies:  Allergies  Allergen Reactions  . Ginger Swelling  . Ibuprofen Swelling    No prescriptions prior to admission    No results found for this or any previous visit (from the past 48 hour(s)). No results found.  Review of Systems  Constitutional: Negative.   Eyes: Negative.   Respiratory: Negative.   Cardiovascular: Positive for chest pain.  Genitourinary:       Stones  Musculoskeletal: Positive for back pain and neck pain.  Skin: Negative.   Neurological: Positive for focal weakness.  Endo/Heme/Allergies: Negative.   Psychiatric/Behavioral: Negative.     There were no vitals taken for this visit. Physical Exam hent,nl. Neck able to move but lateralization produces pain going to the right shoulder,., cv, nl. Lungs, clear. Abdomen, soft. Extremities, nl. Neuro right deltoid s is 2/5 . Weakness of the right biceps and wrist extensors. Sensory, nl. Gait, nl. c spine rays show autofusion at 67. Mri shows spondylosis at 34,45,56.  Assessment/Plan Decompression and fusion from c3 to c6. He and his wife are aware of risks and  benefits.  Joseph Potts 02/15/2014, 5:34 PM

## 2014-02-15 NOTE — Progress Notes (Signed)
Pt denies SOB and chest pain. Pt stated that he was last seen by a cardiologist in 2013 when he had an episode of syncope; pt denies having any episodes since. Pt was under the care of Dr Charlesetta Shanks at the time. Pt stated that he has not had an EKG or chest x ray w/i the last year. Pt stated that he did have several stress tests done ( one with Dr. Charlesetta Shanks and another with Dr. Morrie Sheldon in [place], [place] at [facility]) ; results requested. Pt denies having a H/O CAD, cardiac problems, stents and a MI.

## 2014-02-15 NOTE — Progress Notes (Signed)
Pt made aware to stop taking Aspirin, vitamins, and herbal medications. Do not take any NSAIDs ie: Ibuprofen, Advil, Naproxen or any medication containing Aspirin.

## 2014-02-16 ENCOUNTER — Inpatient Hospital Stay (HOSPITAL_COMMUNITY): Payer: Medicare Other | Admitting: Anesthesiology

## 2014-02-16 ENCOUNTER — Encounter (HOSPITAL_COMMUNITY)
Admission: RE | Disposition: A | Discharge status: Home / Self Care | Payer: Self-pay | Source: Ambulatory Visit | Attending: Neurosurgery

## 2014-02-16 ENCOUNTER — Inpatient Hospital Stay (HOSPITAL_COMMUNITY)
Admission: RE | Admit: 2014-02-16 | Discharge: 2014-02-18 | DRG: 473 | Disposition: A | Discharge status: Home / Self Care | Payer: Medicare Other | Source: Ambulatory Visit | Attending: Neurosurgery | Admitting: Neurosurgery

## 2014-02-16 ENCOUNTER — Encounter (HOSPITAL_COMMUNITY): Payer: Self-pay | Admitting: *Deleted

## 2014-02-16 ENCOUNTER — Inpatient Hospital Stay (HOSPITAL_COMMUNITY): Payer: Medicare Other

## 2014-02-16 DIAGNOSIS — M4802 Spinal stenosis, cervical region: Secondary | ICD-10-CM | POA: Diagnosis not present

## 2014-02-16 DIAGNOSIS — M5020 Other cervical disc displacement, unspecified cervical region: Secondary | ICD-10-CM

## 2014-02-16 DIAGNOSIS — Z7982 Long term (current) use of aspirin: Secondary | ICD-10-CM | POA: Diagnosis not present

## 2014-02-16 DIAGNOSIS — Z4689 Encounter for fitting and adjustment of other specified devices: Secondary | ICD-10-CM | POA: Diagnosis not present

## 2014-02-16 DIAGNOSIS — Z79899 Other long term (current) drug therapy: Secondary | ICD-10-CM

## 2014-02-16 DIAGNOSIS — I10 Essential (primary) hypertension: Secondary | ICD-10-CM | POA: Diagnosis present

## 2014-02-16 DIAGNOSIS — M79601 Pain in right arm: Secondary | ICD-10-CM | POA: Diagnosis not present

## 2014-02-16 DIAGNOSIS — Z886 Allergy status to analgesic agent status: Secondary | ICD-10-CM | POA: Diagnosis not present

## 2014-02-16 DIAGNOSIS — M5412 Radiculopathy, cervical region: Secondary | ICD-10-CM | POA: Diagnosis present

## 2014-02-16 DIAGNOSIS — M542 Cervicalgia: Secondary | ICD-10-CM | POA: Diagnosis not present

## 2014-02-16 HISTORY — DX: Calculus of kidney: N20.0

## 2014-02-16 HISTORY — PX: ANTERIOR CERVICAL DECOMP/DISCECTOMY FUSION: SHX1161

## 2014-02-16 HISTORY — DX: Cardiac murmur, unspecified: R01.1

## 2014-02-16 LAB — SURGICAL PCR SCREEN
MRSA, PCR: NEGATIVE
Staphylococcus aureus: POSITIVE — AB

## 2014-02-16 LAB — CBC
HCT: 39.8 % (ref 39.0–52.0)
Hemoglobin: 13.7 g/dL (ref 13.0–17.0)
MCH: 33.1 pg (ref 26.0–34.0)
MCHC: 34.4 g/dL (ref 30.0–36.0)
MCV: 96.1 fL (ref 78.0–100.0)
PLATELETS: 175 10*3/uL (ref 150–400)
RBC: 4.14 MIL/uL — ABNORMAL LOW (ref 4.22–5.81)
RDW: 12.8 % (ref 11.5–15.5)
WBC: 8.4 10*3/uL (ref 4.0–10.5)

## 2014-02-16 LAB — BASIC METABOLIC PANEL
Anion gap: 10 (ref 5–15)
BUN: 14 mg/dL (ref 6–23)
CALCIUM: 8.9 mg/dL (ref 8.4–10.5)
CO2: 28 mEq/L (ref 19–32)
Chloride: 102 mEq/L (ref 96–112)
Creatinine, Ser: 0.84 mg/dL (ref 0.50–1.35)
GFR calc Af Amer: >90 mL/min (ref >90)
GFR, EST NON AFRICAN AMERICAN: 82 mL/min — AB (ref >90)
Glucose, Bld: 90 mg/dL (ref 70–99)
Potassium: 4.3 mEq/L (ref 3.7–5.3)
Sodium: 140 mEq/L (ref 137–147)

## 2014-02-16 SURGERY — ANTERIOR CERVICAL DECOMPRESSION/DISCECTOMY FUSION 3 LEVELS
Anesthesia: General

## 2014-02-16 MED ORDER — LIDOCAINE HCL (CARDIAC) 20 MG/ML IV SOLN
INTRAVENOUS | Status: DC | PRN
  Administered 2014-02-16: 80 mg via INTRAVENOUS

## 2014-02-16 MED ORDER — ACETAMINOPHEN 650 MG RE SUPP: 650.0000 mg | RECTAL | Status: DC | PRN

## 2014-02-16 MED ORDER — MUPIROCIN 2 % EX OINT
TOPICAL_OINTMENT | CUTANEOUS | Status: AC
  Filled 2014-02-16: qty 22

## 2014-02-16 MED ORDER — ONDANSETRON HCL 4 MG/2ML IJ SOLN
INTRAMUSCULAR | Status: DC | PRN
  Administered 2014-02-16: 4 mg via INTRAVENOUS

## 2014-02-16 MED ORDER — SODIUM CHLORIDE 0.9 % IJ SOLN: 3.0000 mL | INTRAMUSCULAR | Status: DC | PRN

## 2014-02-16 MED ORDER — PHENYLEPHRINE 40 MCG/ML (10ML) SYRINGE FOR IV PUSH (FOR BLOOD PRESSURE SUPPORT)
PREFILLED_SYRINGE | INTRAVENOUS | Status: AC
  Filled 2014-02-16: qty 10

## 2014-02-16 MED ORDER — EPHEDRINE SULFATE 50 MG/ML IJ SOLN
INTRAMUSCULAR | Status: DC | PRN
  Administered 2014-02-16 (×2): 5 mg via INTRAVENOUS

## 2014-02-16 MED ORDER — PROPOFOL 10 MG/ML IV BOLUS
INTRAVENOUS | Status: AC
  Filled 2014-02-16: qty 20

## 2014-02-16 MED ORDER — MUPIROCIN 2 % EX OINT
1.0000 "application " | TOPICAL_OINTMENT | Freq: Once | CUTANEOUS | Status: AC
  Administered 2014-02-16: 1 via TOPICAL

## 2014-02-16 MED ORDER — SODIUM CHLORIDE 0.9 % IV SOLN: 250.0000 mL | INTRAVENOUS | Status: DC

## 2014-02-16 MED ORDER — NEOSTIGMINE METHYLSULFATE 10 MG/10ML IV SOLN
INTRAVENOUS | Status: DC | PRN
  Administered 2014-02-16: 3 mg via INTRAVENOUS

## 2014-02-16 MED ORDER — ROCURONIUM BROMIDE 50 MG/5ML IV SOLN
INTRAVENOUS | Status: AC
  Filled 2014-02-16: qty 1

## 2014-02-16 MED ORDER — SODIUM CHLORIDE 0.9 % IJ SOLN
3.0000 mL | Freq: Two times a day (BID) | INTRAMUSCULAR | Status: DC
  Administered 2014-02-17: 3 mL via INTRAVENOUS

## 2014-02-16 MED ORDER — SURGIFOAM 100 EX MISC
CUTANEOUS | Status: DC | PRN
  Administered 2014-02-16: 20 mL via TOPICAL

## 2014-02-16 MED ORDER — PROPOFOL 10 MG/ML IV BOLUS
INTRAVENOUS | Status: DC | PRN
  Administered 2014-02-16: 140 mg via INTRAVENOUS

## 2014-02-16 MED ORDER — FENTANYL CITRATE 0.05 MG/ML IJ SOLN
INTRAMUSCULAR | Status: DC | PRN
  Administered 2014-02-16: 50 ug via INTRAVENOUS
  Administered 2014-02-16: 150 ug via INTRAVENOUS
  Administered 2014-02-16 (×4): 50 ug via INTRAVENOUS

## 2014-02-16 MED ORDER — DEXAMETHASONE SODIUM PHOSPHATE 4 MG/ML IJ SOLN
4.0000 mg | Freq: Four times a day (QID) | INTRAMUSCULAR | Status: DC
  Administered 2014-02-16 – 2014-02-17 (×3): 4 mg via INTRAVENOUS
  Filled 2014-02-16 (×3): qty 1

## 2014-02-16 MED ORDER — LACTATED RINGERS IV SOLN
INTRAVENOUS | Status: DC
  Administered 2014-02-16: 13:00:00 via INTRAVENOUS

## 2014-02-16 MED ORDER — ONDANSETRON HCL 4 MG/2ML IJ SOLN
INTRAMUSCULAR | Status: AC
  Filled 2014-02-16: qty 2

## 2014-02-16 MED ORDER — DEXAMETHASONE 4 MG PO TABS
4.0000 mg | ORAL_TABLET | Freq: Four times a day (QID) | ORAL | Status: DC
  Administered 2014-02-17 – 2014-02-18 (×3): 4 mg via ORAL
  Filled 2014-02-16 (×2): qty 1

## 2014-02-16 MED ORDER — ONDANSETRON HCL 4 MG/2ML IJ SOLN: 4.0000 mg | INTRAMUSCULAR | Status: DC | PRN

## 2014-02-16 MED ORDER — PHENOL 1.4 % MT LIQD: 1.0000 | OROMUCOSAL | Status: DC | PRN

## 2014-02-16 MED ORDER — SODIUM CHLORIDE 0.9 % IV SOLN
INTRAVENOUS | Status: DC
  Administered 2014-02-17: 02:00:00 via INTRAVENOUS

## 2014-02-16 MED ORDER — GLYCOPYRROLATE 0.2 MG/ML IJ SOLN
INTRAMUSCULAR | Status: DC | PRN
  Administered 2014-02-16: 0.4 mg via INTRAVENOUS

## 2014-02-16 MED ORDER — DIAZEPAM 5 MG PO TABS
5.0000 mg | ORAL_TABLET | Freq: Four times a day (QID) | ORAL | Status: DC | PRN
  Filled 2014-02-16: qty 1

## 2014-02-16 MED ORDER — MENTHOL 3 MG MT LOZG: 1.0000 | LOZENGE | OROMUCOSAL | Status: DC | PRN

## 2014-02-16 MED ORDER — FENTANYL CITRATE 0.05 MG/ML IJ SOLN
INTRAMUSCULAR | Status: AC
  Filled 2014-02-16: qty 5

## 2014-02-16 MED ORDER — 0.9 % SODIUM CHLORIDE (POUR BTL) OPTIME
TOPICAL | Status: DC | PRN
  Administered 2014-02-16: 1000 mL

## 2014-02-16 MED ORDER — CEFAZOLIN SODIUM-DEXTROSE 2-3 GM-% IV SOLR
2.0000 g | INTRAVENOUS | Status: AC
  Administered 2014-02-16: 2 g via INTRAVENOUS
  Filled 2014-02-16 (×2): qty 50

## 2014-02-16 MED ORDER — CEFAZOLIN SODIUM 1-5 GM-% IV SOLN
1.0000 g | Freq: Three times a day (TID) | INTRAVENOUS | Status: AC
  Administered 2014-02-16 – 2014-02-17 (×2): 1 g via INTRAVENOUS
  Filled 2014-02-16 (×2): qty 50

## 2014-02-16 MED ORDER — HYDROMORPHONE HCL 1 MG/ML IJ SOLN: 0.2500 mg | INTRAMUSCULAR | Status: DC | PRN

## 2014-02-16 MED ORDER — THROMBIN 5000 UNITS EX SOLR
OROMUCOSAL | Status: DC | PRN
  Administered 2014-02-16: 17:00:00 via TOPICAL

## 2014-02-16 MED ORDER — MORPHINE SULFATE 2 MG/ML IJ SOLN: 1.0000 mg | INTRAMUSCULAR | Status: DC | PRN

## 2014-02-16 MED ORDER — OXYCODONE-ACETAMINOPHEN 5-325 MG PO TABS: 1.0000 | ORAL_TABLET | ORAL | Status: DC | PRN

## 2014-02-16 MED ORDER — LACTATED RINGERS IV SOLN
INTRAVENOUS | Status: DC | PRN
  Administered 2014-02-16 (×2): via INTRAVENOUS

## 2014-02-16 MED ORDER — ACETAMINOPHEN 325 MG PO TABS: 650.0000 mg | ORAL_TABLET | ORAL | Status: DC | PRN

## 2014-02-16 MED ORDER — ROCURONIUM BROMIDE 100 MG/10ML IV SOLN
INTRAVENOUS | Status: DC | PRN
  Administered 2014-02-16: 50 mg via INTRAVENOUS

## 2014-02-16 MED ORDER — ZOLPIDEM TARTRATE 5 MG PO TABS: 5.0000 mg | ORAL_TABLET | Freq: Every evening | ORAL | Status: DC | PRN

## 2014-02-16 SURGICAL SUPPLY — 82 items
APL SKNCLS STERI-STRIP NONHPOA (GAUZE/BANDAGES/DRESSINGS) ×1
BENZOIN TINCTURE PRP APPL 2/3 (GAUZE/BANDAGES/DRESSINGS) ×3 IMPLANT
BIT DRILL SM SPINE QC 12 (BIT) ×2 IMPLANT
BLADE ULTRA TIP 2M (BLADE) ×3 IMPLANT
BNDG GAUZE ELAST 4 BULKY (GAUZE/BANDAGES/DRESSINGS) ×6 IMPLANT
BUR BARREL STRAIGHT FLUTE 4.0 (BURR) IMPLANT
BUR MATCHSTICK NEURO 3.0 LAGG (BURR) ×3 IMPLANT
CANISTER SUCT 3000ML (MISCELLANEOUS) ×3 IMPLANT
CLOSURE WOUND 1/2 X4 (GAUZE/BANDAGES/DRESSINGS) ×1
CONT SPEC 4OZ CLIKSEAL STRL BL (MISCELLANEOUS) ×3 IMPLANT
CORDS BIPOLAR (ELECTRODE) ×2 IMPLANT
COVER MAYO STAND STRL (DRAPES) ×3 IMPLANT
DRAIN JACKSON PRATT 10MM FLAT (MISCELLANEOUS) ×2 IMPLANT
DRAPE C-ARM 42X72 X-RAY (DRAPES) ×6 IMPLANT
DRAPE LAPAROTOMY 100X72 PEDS (DRAPES) ×3 IMPLANT
DRAPE MICROSCOPE LEICA (MISCELLANEOUS) ×3 IMPLANT
DRAPE POUCH INSTRU U-SHP 10X18 (DRAPES) ×3 IMPLANT
DRAPE PROXIMA HALF (DRAPES) IMPLANT
DRSG OPSITE POSTOP 4X8 (GAUZE/BANDAGES/DRESSINGS) ×2 IMPLANT
DURAPREP 6ML APPLICATOR 50/CS (WOUND CARE) ×3 IMPLANT
ELECT REM PT RETURN 9FT ADLT (ELECTROSURGICAL) ×3
ELECTRODE REM PT RTRN 9FT ADLT (ELECTROSURGICAL) ×1 IMPLANT
EVACUATOR SILICONE 100CC (DRAIN) ×2 IMPLANT
GAUZE SPONGE 4X4 12PLY STRL (GAUZE/BANDAGES/DRESSINGS) ×1 IMPLANT
GAUZE SPONGE 4X4 16PLY XRAY LF (GAUZE/BANDAGES/DRESSINGS) IMPLANT
GLOVE BIO SURGEON STRL SZ 6.5 (GLOVE) IMPLANT
GLOVE BIO SURGEON STRL SZ7 (GLOVE) IMPLANT
GLOVE BIO SURGEON STRL SZ7.5 (GLOVE) IMPLANT
GLOVE BIO SURGEON STRL SZ8 (GLOVE) ×4 IMPLANT
GLOVE BIO SURGEON STRL SZ8.5 (GLOVE) IMPLANT
GLOVE BIO SURGEONS STRL SZ 6.5 (GLOVE)
GLOVE BIOGEL M 8.0 STRL (GLOVE) ×3 IMPLANT
GLOVE BIOGEL PI IND STRL 7.5 (GLOVE) IMPLANT
GLOVE BIOGEL PI IND STRL 8.5 (GLOVE) IMPLANT
GLOVE BIOGEL PI INDICATOR 7.5 (GLOVE) ×4
GLOVE BIOGEL PI INDICATOR 8.5 (GLOVE) ×4
GLOVE ECLIPSE 6.5 STRL STRAW (GLOVE) IMPLANT
GLOVE ECLIPSE 7.0 STRL STRAW (GLOVE) ×2 IMPLANT
GLOVE ECLIPSE 7.5 STRL STRAW (GLOVE) IMPLANT
GLOVE ECLIPSE 8.0 STRL XLNG CF (GLOVE) IMPLANT
GLOVE ECLIPSE 8.5 STRL (GLOVE) IMPLANT
GLOVE EXAM NITRILE LRG STRL (GLOVE) IMPLANT
GLOVE EXAM NITRILE MD LF STRL (GLOVE) IMPLANT
GLOVE EXAM NITRILE XL STR (GLOVE) IMPLANT
GLOVE EXAM NITRILE XS STR PU (GLOVE) IMPLANT
GLOVE INDICATOR 6.5 STRL GRN (GLOVE) IMPLANT
GLOVE INDICATOR 7.0 STRL GRN (GLOVE) IMPLANT
GLOVE INDICATOR 7.5 STRL GRN (GLOVE) IMPLANT
GLOVE INDICATOR 8.0 STRL GRN (GLOVE) IMPLANT
GLOVE INDICATOR 8.5 STRL (GLOVE) IMPLANT
GLOVE OPTIFIT SS 8.0 STRL (GLOVE) IMPLANT
GLOVE SURG SS PI 6.5 STRL IVOR (GLOVE) IMPLANT
GOWN STRL REUS W/ TWL LRG LVL3 (GOWN DISPOSABLE) ×1 IMPLANT
GOWN STRL REUS W/ TWL XL LVL3 (GOWN DISPOSABLE) IMPLANT
GOWN STRL REUS W/TWL 2XL LVL3 (GOWN DISPOSABLE) IMPLANT
GOWN STRL REUS W/TWL LRG LVL3 (GOWN DISPOSABLE) ×6
GOWN STRL REUS W/TWL XL LVL3 (GOWN DISPOSABLE) ×3
HALTER HD/CHIN CERV TRACTION D (MISCELLANEOUS) ×3 IMPLANT
HEMOSTAT POWDER SURGIFOAM 1G (HEMOSTASIS) ×2 IMPLANT
KIT BASIN OR (CUSTOM PROCEDURE TRAY) ×3 IMPLANT
KIT ROOM TURNOVER OR (KITS) ×3 IMPLANT
NDL SPNL 22GX3.5 QUINCKE BK (NEEDLE) ×1 IMPLANT
NEEDLE SPNL 22GX3.5 QUINCKE BK (NEEDLE) ×3 IMPLANT
NS IRRIG 1000ML POUR BTL (IV SOLUTION) ×3 IMPLANT
PACK LAMINECTOMY NEURO (CUSTOM PROCEDURE TRAY) ×3 IMPLANT
PATTIES SURGICAL .5 X1 (DISPOSABLE) ×3 IMPLANT
PLATE ANT CERV XTEND 3 LV (Plate) ×2 IMPLANT
PUTTY DBX 1CC (Putty) ×3 IMPLANT
PUTTY DBX 1CC DEPUY (Putty) IMPLANT
RUBBERBAND STERILE (MISCELLANEOUS) ×6 IMPLANT
SCREW XTD VAR 4.2 SELF TAP 12 (Screw) ×16 IMPLANT
SPACER ACDF SM LORDOTIC 7 (Spacer) ×4 IMPLANT
SPACER COLONIAL SZ 6-7 (Spacer) ×2 IMPLANT
SPONGE INTESTINAL PEANUT (DISPOSABLE) ×6 IMPLANT
SPONGE SURGIFOAM ABS GEL 100 (HEMOSTASIS) ×3 IMPLANT
STRIP CLOSURE SKIN 1/2X4 (GAUZE/BANDAGES/DRESSINGS) ×2 IMPLANT
SUT VIC AB 3-0 SH 8-18 (SUTURE) ×3 IMPLANT
SYR 20ML ECCENTRIC (SYRINGE) ×3 IMPLANT
TOWEL OR 17X24 6PK STRL BLUE (TOWEL DISPOSABLE) ×3 IMPLANT
TOWEL OR 17X26 10 PK STRL BLUE (TOWEL DISPOSABLE) ×3 IMPLANT
TRAY FOLEY CATH 16FRSI W/METER (SET/KITS/TRAYS/PACK) ×2 IMPLANT
WATER STERILE IRR 1000ML POUR (IV SOLUTION) ×3 IMPLANT

## 2014-02-16 NOTE — Anesthesia Postprocedure Evaluation (Signed)
  Anesthesia Post-op Note  Patient: Joseph Potts  Procedure(s) Performed: Procedure(s) with comments: CERVICAL THREE TO FOUR, CERVICAL FOUR TO FIVE, CERVICAL FIVE TO SIX  ANTERIOR CERVICAL DECOMPRESSION/DISKECTOMY/FUSION (N/A) - C3-4 C4-5 C5-6 ANTERIOR CERVICAL DECOMPRESSION/DISKECTOMY/FUSION  Patient Location: PACU  Anesthesia Type:General  Level of Consciousness: awake and alert   Airway and Oxygen Therapy: Patient Spontanous Breathing  Post-op Pain: mild  Post-op Assessment: Post-op Vital signs reviewed and Patient's Cardiovascular Status Stable  Post-op Vital Signs: stable  Last Vitals:  Filed Vitals:   02/16/14 2000  BP: 174/89  Pulse: 70  Temp:   Resp: 12    Complications: No apparent anesthesia complications

## 2014-02-16 NOTE — Anesthesia Procedure Notes (Signed)
Procedure Name: Intubation Date/Time: 02/16/2014 4:08 PM Performed by: Maeola Harman Pre-anesthesia Checklist: Patient identified, Emergency Drugs available, Suction available, Patient being monitored and Timeout performed Patient Re-evaluated:Patient Re-evaluated prior to inductionOxygen Delivery Method: Circle system utilized Preoxygenation: Pre-oxygenation with 100% oxygen Intubation Type: IV induction Ventilation: Mask ventilation without difficulty Laryngoscope Size: Mac and 4 Grade View: Grade I Tube type: Oral Tube size: 7.5 mm Number of attempts: 1 Airway Equipment and Method: Stylet Placement Confirmation: ETT inserted through vocal cords under direct vision,  positive ETCO2 and breath sounds checked- equal and bilateral Secured at: 22 cm Tube secured with: Tape Dental Injury: Teeth and Oropharynx as per pre-operative assessment  Comments: Easy atraumatic induction and intubation with MAC 4 blade.  Dr. Oletta Lamas verified placement.  Joseph Session, CRNA

## 2014-02-16 NOTE — Op Note (Signed)
NAME:  Joseph Potts, JUNIOUS NO.:  [id number]  MEDICAL RECORD NO.:  [id number]  LOCATION:  [facility]                         FACILITY:  [facility]  PHYSICIAN:  Leeroy Cha, M.D.   DATE OF BIRTH:  [birth year]  DATE OF PROCEDURE:  02/16/2014 DATE OF DISCHARGE:                              OPERATIVE REPORT   PREOPERATIVE DIAGNOSES:  Cervical stenosis with right-sided radiculopathy.  Weakness of the deltoid.  POSTOPERATIVE DIAGNOSES:  Cervical stenosis with right-sided radiculopathy.  Weakness of the deltoid.  PROCEDURE:  Anterior-cervical 3-4, 4-5, 5-6 diskectomy, decompression of the spinal cord, foraminotomy, interbody fusion with cages, plate, microscope.  SURGEON:  Leeroy Cha, M.D.  ASSISTANT:  Marchia Meiers. Vertell Limber, M.D.  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  He had an MRI, which showed stenosis at the level of cervical 3-4, 4-5, 5-6 mostly to the right side.  We talked about conservative treatment, surgery anterior and posterior approach and at the end, he agreed with anterior approach.  He knew the risks and benefits.  DESCRIPTION OF PROCEDURE:  The patient was taken to the OR, and after intubation, the left side of the neck was cleaned with DuraPrep.  Drapes were applied.  Transverse incision was made through the skin, subcutaneous tissue, platysma straight down to the cervical spine.  X- rays showed indeed we were right at the level of 4-5.  With the help of the microscope, I opened the anterior ligament.  Diskectomy was achieved.  Posteriorly after I opened the posterior ligament, there was a large osteophyte mostly going through the right side.  I drilled it and with the 1 and 2-mm Kerrison punch, decompression was achieved for the C5 nerve root.  The same procedure was done on the left side with decompression of the spinal cord.  At the  level of 3-4 and 5-6, we found the same finding mostly large osteophyte going to the right side. Decompression of the spinal cord on both foramen to decompress the C4 and C6 nerve root was achieved.  From then on, the endplates were removed.  A cage of 6-mm height, lordotic with DBX autograft was inserted at the level of 3-4 and at the level of 4-5 and 5-6, it was 7 mm.  Then, a plate using 8 screws was used to keep the cages in place. The area was irrigated.  A drain was left in the precervical area and the wound was closed with Vicryl and Steri-Strip.          ______________________________ Leeroy Cha, M.D.    EB/MEDQ  D:  02/16/2014  T:  02/16/2014  Job:  [id number]

## 2014-02-16 NOTE — Transfer of Care (Signed)
Immediate Anesthesia Transfer of Care Note  Patient: Joseph Potts  Procedure(s) Performed: Procedure(s) with comments: CERVICAL THREE TO FOUR, CERVICAL FOUR TO FIVE, CERVICAL FIVE TO SIX  ANTERIOR CERVICAL DECOMPRESSION/DISKECTOMY/FUSION (N/A) - C3-4 C4-5 C5-6 ANTERIOR CERVICAL DECOMPRESSION/DISKECTOMY/FUSION  Patient Location: PACU  Anesthesia Type:General  Level of Consciousness: awake, alert  and oriented  Airway & Oxygen Therapy: Patient connected to face mask oxygen  Post-op Assessment: Report given to PACU RN  Post vital signs: stable  Complications: No apparent anesthesia complications

## 2014-02-16 NOTE — Anesthesia Preprocedure Evaluation (Addendum)
Anesthesia Evaluation  Patient identified by MRN, date of birth, ID band Patient awake    Reviewed: H&P , NPO status , Patient's Chart, lab work & pertinent test results  Airway Mallampati: II  TM Distance: >3 FB Neck ROM: Full    Dental  (+) Teeth Intact, Dental Advisory Given   Pulmonary neg pulmonary ROS,  breath sounds clear to auscultation        Cardiovascular hypertension, + Valvular Problems/Murmurs Rhythm:Regular Rate:Normal     Neuro/Psych negative neurological ROS  negative psych ROS   GI/Hepatic negative GI ROS, Neg liver ROS,   Endo/Other  negative endocrine ROS  Renal/GU Renal InsufficiencyRenal disease     Musculoskeletal negative musculoskeletal ROS (+)   Abdominal (+)  Abdomen: soft. Bowel sounds: normal.  Peds  Hematology negative hematology ROS (+)   Anesthesia Other Findings   Reproductive/Obstetrics negative OB ROS                         Anesthesia Physical Anesthesia Plan  ASA: III  Anesthesia Plan: General   Post-op Pain Management:    Induction: Intravenous  Airway Management Planned: Oral ETT  Additional Equipment:   Intra-op Plan:   Post-operative Plan: Extubation in OR  Informed Consent: I have reviewed the patients History and Physical, chart, labs and discussed the procedure including the risks, benefits and alternatives for the proposed anesthesia with the patient or authorized representative who has indicated his/her understanding and acceptance.   Dental advisory given  Plan Discussed with: CRNA, Anesthesiologist and Surgeon  Anesthesia Plan Comments:         Anesthesia Quick Evaluation

## 2014-02-17 ENCOUNTER — Encounter (HOSPITAL_COMMUNITY): Payer: Self-pay | Admitting: Neurosurgery

## 2014-02-17 NOTE — Progress Notes (Signed)
Occupational Therapy Evaluation and Discharge Patient Details Name: Joseph Potts MRN: [id number] DOB: 1932-12-04 Today's Date: 02/17/2014    History of Present Illness 80 yo male s/p C3-4 4-5 5-6 ACDF PMH: HTN, SVT, syncope, spontaneous pneumothorax,  back surg, thoracotomy 2008   Clinical Impression   PTA pt lived at home and was independent with ADLs and IADLs. 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 He is currently at a Supervision level nearing Mod I for ADLs and will have 24/7 assistance. No further acute OT needs. Educated pt on cervical precautions.     Follow Up Recommendations  No OT follow up;Supervision - Intermittent    Equipment Recommendations  None recommended by OT    Recommendations for Other Services       Precautions / Restrictions Precautions Precautions: Cervical Precaution Comments: provided pt with cervical handout and educated on precautions. Required Braces or Orthoses: Cervical Brace Cervical Brace: Hard collar Restrictions Weight Bearing Restrictions: No      Mobility Bed Mobility Overal bed mobility: Modified Independent                Transfers Overall transfer level: Modified independent                    Balance Overall balance assessment: No apparent balance deficits (not formally assessed)                                          ADL Overall ADL's : Needs assistance/impaired Eating/Feeding: Independent;Sitting   Grooming: Supervision/safety;Standing   Upper Body Bathing: Set up;Sitting   Lower Body Bathing: Supervison/ safety;Set up;Sit to/from stand   Upper Body Dressing : Set up;Sitting   Lower Body Dressing: Supervision/safety;Set up;Sit to/from stand Lower Body Dressing Details (indicate cue type and reason): pt able to reach BLE to adjust socks in sitting using figure four method Toilet Transfer: Supervision/safety;Ambulation   Toileting- Clothing Manipulation and Hygiene:  Supervision/safety;Sit to/from stand       Functional mobility during ADLs: Supervision/safety General ADL Comments: Educated pt on donning/doffing cervical collar for bathing and dressing (if allowed by MD). Educated pt on incorporating cervical precautious into ADLs.      Vision  Pt reports no change from baseline.                    Perception Perception Perception Tested?: No   Praxis Praxis Praxis tested?: Within functional limits    Pertinent Vitals/Pain Pain Assessment: 0-10 Pain Score: 3  Pain Location: surgical; throat Pain Descriptors / Indicators: Aching;Sore Pain Intervention(s): Monitored during session;Repositioned;Other (comment) (encouraged hot liquids for sore throat)     Hand Dominance Right   Extremity/Trunk Assessment Upper Extremity Assessment Upper Extremity Assessment: Overall WFL for tasks assessed   Lower Extremity Assessment Lower Extremity Assessment: Overall WFL for tasks assessed   Cervical / Trunk Assessment Cervical / Trunk Assessment: Normal (cervical surgery)   Communication Communication Communication: No difficulties   Cognition Arousal/Alertness: Awake/alert Behavior During Therapy: WFL for tasks assessed/performed Overall Cognitive Status: Within Functional Limits for tasks assessed                                Home Living Family/patient expects to be discharged to:: Private residence Living Arrangements: Spouse/significant other;Other relatives (grandson) Available Help at Discharge: Family;Available 24 hours/day Type  of Home: House Home Access: Stairs to enter     Home Layout: Two level;Able to live on main level with bedroom/bathroom     Bathroom Shower/Tub: Occupational psychologist: Standard     Home Equipment: None          Prior Functioning/Environment Level of Independence: Independent        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     OT Diagnosis: Generalized weakness;Acute pain     End of Session Equipment Utilized During Treatment: Gait belt;Rolling walker;Cervical collar  Activity Tolerance: Patient tolerated treatment well Patient left: in bed;with call bell/phone within reach   Time: 0818-0838 OT Time Calculation (min): 20 min Charges:  OT General Charges $OT Visit: 1 Procedure OT Evaluation $Initial OT Evaluation Tier I: 1 Procedure OT Treatments $Self Care/Home Management : 8-22 mins  Juluis Rainier 02/17/2014, 9:09 AM   Cyndie Chime, OTR/L Occupational Therapist [phone] (pager)

## 2014-02-17 NOTE — Progress Notes (Signed)
Patient ID: Joseph Potts, male   DOB: 01-Mar-1933, 80 y.o.   MRN: [id number] Pain better. No weakness. Still some drainage. Plan dc in am. i will check to see if we can remove the drain tonite based in the amount of dainage. Family agrees

## 2014-02-17 NOTE — Progress Notes (Addendum)
Pt removed his IV, states he does not want another one placed.  No IV medications due.  Discharge planned for the AM.  Will continue to monitor. Cori Razor, RN

## 2014-02-17 NOTE — Evaluation (Addendum)
Physical Therapy Evaluation Patient Details Name: Joseph Potts MRN: [id number] DOB: 05/08/1932 Today's Date: 02/17/2014   History of Present Illness  80 yo male s/p C3-4 4-5 5-6 ACDF PMH: HTN, SVT, syncope, spontaneous pneumothorax,  back surg, thoracotomy 2008.    Clinical Impression  Patient is able to tolerate ambulating community distances and negotiating steps without difficulty or LOB. Pt able to recall cervical precautions independently. Pt does not require skilled therapy services as pt is functioning close to baseline and has the necessary supervision at home. Discharge from therapy. Encourage ambulation daily in hallway to maintain mobility/strength.    Follow Up Recommendations No PT follow up;Supervision - Intermittent    Equipment Recommendations  None recommended by PT    Recommendations for Other Services       Precautions / Restrictions Precautions Precautions: Cervical Precaution Comments: Able to recall precautions independently. Required Braces or Orthoses: Cervical Brace Cervical Brace: Hard collar Restrictions Weight Bearing Restrictions: No      Mobility  Bed Mobility Overal bed mobility: Modified Independent                Transfers Overall transfer level: Modified independent Equipment used: None                Ambulation/Gait Ambulation/Gait assistance: Modified independent (Device/Increase time) Ambulation Distance (Feet): 300 Feet Assistive device: None Gait Pattern/deviations: Step-through pattern;Decreased stride length   Gait velocity interpretation: at or above normal speed for age/gender General Gait Details: Pt with steady gait. Reminders to maintain cervical precautions during turns.  Stairs Stairs: Yes Stairs assistance: Modified independent (Device/Increase time) Stair Management: One rail Right;Alternating pattern Number of Stairs: 2    Wheelchair Mobility    Modified Rankin (Stroke Patients Only)        Balance Overall balance assessment: Needs assistance Sitting-balance support: Feet supported;No upper extremity supported Sitting balance-Leahy Scale: Good     Standing balance support: During functional activity Standing balance-Leahy Scale: Good                               Pertinent Vitals/Pain Pain Assessment: No/denies pain    Home Living Family/patient expects to be discharged to:: Private residence Living Arrangements: Spouse/significant other;Other relatives Available Help at Discharge: Family;Available 24 hours/day Type of Home: House Home Access: Stairs to enter Entrance Stairs-Rails: None Entrance Stairs-Number of Steps: 1 Home Layout: Two level;Able to live on main level with bedroom/bathroom Home Equipment: None      Prior Function Level of Independence: Independent         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      Hand Dominance   Dominant Hand: Right    Extremity/Trunk Assessment   Upper Extremity Assessment: Overall WFL for tasks assessed;Defer to OT evaluation           Lower Extremity Assessment: Overall WFL for tasks assessed      Cervical / Trunk Assessment: Normal  Communication   Communication: No difficulties  Cognition Arousal/Alertness: Awake/alert Behavior During Therapy: WFL for tasks assessed/performed Overall Cognitive Status: Within Functional Limits for tasks assessed                      General Comments General comments (skin integrity, edema, etc.): Reviewed cervical precautions and encouraged mobility while in hospital to maintain strength/mobility.    Exercises        Assessment/Plan    PT Assessment Patent does not need any  further PT services  PT Diagnosis     PT Problem List    PT Treatment Interventions     PT Goals (Current goals can be found in the Care Plan section) Acute Rehab PT Goals PT Goal Formulation: All assessment and education complete, DC therapy    Frequency      Barriers to discharge        Co-evaluation               End of Session Equipment Utilized During Treatment: Gait belt Activity Tolerance: Patient tolerated treatment well Patient left: in bed;with call bell/phone within reach;with bed alarm set;with family/visitor present Nurse Communication: Mobility status;Precautions         Time: 8006-3494 PT Time Calculation (min) (ACUTE ONLY): 19 min   Charges:   PT Evaluation $Initial PT Evaluation Tier I: 1 Procedure PT Treatments $Gait Training: 8-22 mins   PT G CodesCandy Sledge A 02/17/2014, 1:07 PM  Candy Sledge, [place], DPT [phone]

## 2014-02-17 NOTE — Progress Notes (Signed)
Pt arrived to unit via stretcher.  Pt ambulated from stretcher to room bed with stand by assist.  Pt oriented to room and plan of care for the night.  Vitals and assessment stable.  Will continue to monitor.

## 2014-02-18 NOTE — Discharge Summary (Signed)
Physician Discharge Summary  Patient ID: Joseph Potts MRN: [id number] DOB/AGE: Jun 22, 1932 80 y.o.  Admit date: 02/16/2014 Discharge date: 02/18/2014  Admission Diagnoses:cervical stenosis  Discharge Diagnoses:  Active Problems:   Cervical stenosis of spinal canal   Discharged Condition: no pain or weakness  Hospital Course: surgery  Consults: none  Significant Diagnostic Studies: mri  Treatments: cervical fusion  Discharge Exam: Blood pressure 132/63, pulse 69, temperature 97.9 F (36.6 C), temperature source Oral, resp. rate 18, height 6' (1.829 m), weight 87.726 kg (193 lb 6.4 oz), SpO2 98 %. No weakness. Some drainage  Disposition: home with the drain. i will call him in am and set up a time to see me in my office to dc the drain     Medication List    ASK your doctor about these medications        ADVANCED JOINT RELIEF PO  Take 1 tablet by mouth daily.     Alpha-Lipoic Acid 300 MG Caps  Take 1 capsule by mouth daily.     aspirin EC 81 MG tablet  Take 81 mg by mouth daily.     b complex vitamins tablet  Take 1 tablet by mouth daily.     BIOTIN 5000 PO  Take 1 tablet by mouth daily.     CoQ10 100 MG Caps  Take 1 capsule by mouth daily.     L-Arginine 1000 MG Tabs  Take 1,000 mg by mouth daily.     Lutein 20 MG Caps  Take 1 capsule by mouth daily.     magnesium gluconate 500 MG tablet  Commonly known as:  MAGONATE  Take 500 mg by mouth daily.     meloxicam 15 MG tablet  Commonly known as:  MOBIC  Take 15 mg by mouth daily.     niacinamide 500 MG tablet  Take 500 mg by mouth daily.     OVER THE COUNTER MEDICATION  Take 1 tablet by mouth daily. After 50 dietary supplement     OVER THE COUNTER MEDICATION  Take 1 tablet by mouth daily. Advance blood pressure support     selenium 50 MCG Tabs tablet  Take 200 mcg by mouth daily.     vitamin C 500 MG tablet  Commonly known as:  ASCORBIC ACID  Take 1,000 mg by mouth daily.     Vitamin  D3 5000 UNITS Tabs  Take 5,000 Units by mouth daily.     vitamin E 400 UNIT capsule  Take 400 Units by mouth daily.     zinc gluconate 50 MG tablet  Take 50 mg by mouth daily.         Signed: Floyce Stakes 02/18/2014, 9:41 AM

## 2014-02-18 NOTE — Progress Notes (Signed)
Pt is being discharged home. Discharge instructions were given to patient and family 

## 2014-02-25 ENCOUNTER — Ambulatory Visit: Payer: Medicare Other | Attending: Neurosurgery | Admitting: Physical Therapy

## 2014-02-25 DIAGNOSIS — M545 Low back pain: Secondary | ICD-10-CM | POA: Insufficient documentation

## 2014-02-25 DIAGNOSIS — M546 Pain in thoracic spine: Secondary | ICD-10-CM | POA: Insufficient documentation

## 2014-03-01 ENCOUNTER — Ambulatory Visit: Payer: Medicare Other | Admitting: Physical Therapy

## 2014-03-01 DIAGNOSIS — M545 Low back pain: Secondary | ICD-10-CM | POA: Diagnosis not present

## 2014-03-01 DIAGNOSIS — M546 Pain in thoracic spine: Secondary | ICD-10-CM | POA: Diagnosis not present

## 2014-03-08 ENCOUNTER — Ambulatory Visit: Payer: Medicare Other | Admitting: Physical Therapy

## 2014-03-08 DIAGNOSIS — M546 Pain in thoracic spine: Secondary | ICD-10-CM | POA: Diagnosis not present

## 2014-03-08 DIAGNOSIS — M545 Low back pain: Secondary | ICD-10-CM | POA: Diagnosis not present

## 2014-03-12 ENCOUNTER — Ambulatory Visit: Payer: Medicare Other | Admitting: Physical Therapy

## 2014-03-12 DIAGNOSIS — M546 Pain in thoracic spine: Secondary | ICD-10-CM | POA: Diagnosis not present

## 2014-03-12 DIAGNOSIS — M545 Low back pain: Secondary | ICD-10-CM | POA: Diagnosis not present

## 2014-03-15 ENCOUNTER — Ambulatory Visit: Payer: Medicare Other | Admitting: Physical Therapy

## 2014-03-15 DIAGNOSIS — M545 Low back pain: Secondary | ICD-10-CM | POA: Diagnosis not present

## 2014-03-15 DIAGNOSIS — M546 Pain in thoracic spine: Secondary | ICD-10-CM | POA: Diagnosis not present

## 2014-03-22 ENCOUNTER — Ambulatory Visit: Payer: Medicare Other | Admitting: Physical Therapy

## 2014-03-22 DIAGNOSIS — M546 Pain in thoracic spine: Secondary | ICD-10-CM | POA: Diagnosis not present

## 2014-03-22 DIAGNOSIS — M545 Low back pain: Secondary | ICD-10-CM | POA: Diagnosis not present

## 2014-03-25 ENCOUNTER — Ambulatory Visit: Payer: Medicare Other | Admitting: Physical Therapy

## 2014-03-29 ENCOUNTER — Ambulatory Visit: Payer: Medicare Other | Attending: Neurosurgery | Admitting: Physical Therapy

## 2014-03-29 DIAGNOSIS — M546 Pain in thoracic spine: Secondary | ICD-10-CM | POA: Diagnosis not present

## 2014-03-29 DIAGNOSIS — M545 Low back pain: Secondary | ICD-10-CM | POA: Diagnosis not present

## 2014-03-29 DIAGNOSIS — R030 Elevated blood-pressure reading, without diagnosis of hypertension: Secondary | ICD-10-CM | POA: Diagnosis not present

## 2014-03-29 DIAGNOSIS — M5022 Other cervical disc displacement, mid-cervical region: Secondary | ICD-10-CM | POA: Diagnosis not present

## 2014-03-29 DIAGNOSIS — Z6825 Body mass index (BMI) 25.0-25.9, adult: Secondary | ICD-10-CM | POA: Diagnosis not present

## 2014-04-01 ENCOUNTER — Ambulatory Visit: Payer: Medicare Other | Admitting: Physical Therapy

## 2014-04-01 DIAGNOSIS — M545 Low back pain: Secondary | ICD-10-CM | POA: Diagnosis not present

## 2014-04-01 DIAGNOSIS — M546 Pain in thoracic spine: Secondary | ICD-10-CM | POA: Diagnosis not present

## 2014-04-14 DIAGNOSIS — M9901 Segmental and somatic dysfunction of cervical region: Secondary | ICD-10-CM | POA: Diagnosis not present

## 2014-04-14 DIAGNOSIS — M5033 Other cervical disc degeneration, cervicothoracic region: Secondary | ICD-10-CM | POA: Diagnosis not present

## 2014-04-19 DIAGNOSIS — M5033 Other cervical disc degeneration, cervicothoracic region: Secondary | ICD-10-CM | POA: Diagnosis not present

## 2014-04-19 DIAGNOSIS — M9901 Segmental and somatic dysfunction of cervical region: Secondary | ICD-10-CM | POA: Diagnosis not present

## 2014-04-21 DIAGNOSIS — M9901 Segmental and somatic dysfunction of cervical region: Secondary | ICD-10-CM | POA: Diagnosis not present

## 2014-04-21 DIAGNOSIS — M5033 Other cervical disc degeneration, cervicothoracic region: Secondary | ICD-10-CM | POA: Diagnosis not present

## 2014-04-26 DIAGNOSIS — M5033 Other cervical disc degeneration, cervicothoracic region: Secondary | ICD-10-CM | POA: Diagnosis not present

## 2014-04-26 DIAGNOSIS — M9901 Segmental and somatic dysfunction of cervical region: Secondary | ICD-10-CM | POA: Diagnosis not present

## 2014-04-29 DIAGNOSIS — M5022 Other cervical disc displacement, mid-cervical region: Secondary | ICD-10-CM | POA: Diagnosis not present

## 2014-05-03 DIAGNOSIS — M5033 Other cervical disc degeneration, cervicothoracic region: Secondary | ICD-10-CM | POA: Diagnosis not present

## 2014-05-03 DIAGNOSIS — M9901 Segmental and somatic dysfunction of cervical region: Secondary | ICD-10-CM | POA: Diagnosis not present

## 2014-05-11 DIAGNOSIS — M5033 Other cervical disc degeneration, cervicothoracic region: Secondary | ICD-10-CM | POA: Diagnosis not present

## 2014-05-11 DIAGNOSIS — M9901 Segmental and somatic dysfunction of cervical region: Secondary | ICD-10-CM | POA: Diagnosis not present

## 2014-05-18 DIAGNOSIS — M5033 Other cervical disc degeneration, cervicothoracic region: Secondary | ICD-10-CM | POA: Diagnosis not present

## 2014-05-18 DIAGNOSIS — M9901 Segmental and somatic dysfunction of cervical region: Secondary | ICD-10-CM | POA: Diagnosis not present

## 2014-05-24 DIAGNOSIS — M5033 Other cervical disc degeneration, cervicothoracic region: Secondary | ICD-10-CM | POA: Diagnosis not present

## 2014-05-24 DIAGNOSIS — M9901 Segmental and somatic dysfunction of cervical region: Secondary | ICD-10-CM | POA: Diagnosis not present

## 2014-05-26 DIAGNOSIS — M5033 Other cervical disc degeneration, cervicothoracic region: Secondary | ICD-10-CM | POA: Diagnosis not present

## 2014-05-26 DIAGNOSIS — M9901 Segmental and somatic dysfunction of cervical region: Secondary | ICD-10-CM | POA: Diagnosis not present

## 2014-05-27 DIAGNOSIS — L02512 Cutaneous abscess of left hand: Secondary | ICD-10-CM | POA: Diagnosis not present

## 2014-05-28 DIAGNOSIS — L02512 Cutaneous abscess of left hand: Secondary | ICD-10-CM | POA: Diagnosis not present

## 2014-06-03 DIAGNOSIS — M5033 Other cervical disc degeneration, cervicothoracic region: Secondary | ICD-10-CM | POA: Diagnosis not present

## 2014-06-03 DIAGNOSIS — M9901 Segmental and somatic dysfunction of cervical region: Secondary | ICD-10-CM | POA: Diagnosis not present

## 2014-12-13 ENCOUNTER — Encounter: Payer: Self-pay | Admitting: Family Medicine

## 2014-12-13 ENCOUNTER — Ambulatory Visit (INDEPENDENT_AMBULATORY_CARE_PROVIDER_SITE_OTHER): Payer: Medicare Other | Admitting: Family Medicine

## 2014-12-13 VITALS — BP 162/80 | HR 49 | Temp 97.0°F | Ht 72.0 in | Wt 182.0 lb

## 2014-12-13 DIAGNOSIS — N23 Unspecified renal colic: Secondary | ICD-10-CM | POA: Diagnosis not present

## 2014-12-13 DIAGNOSIS — N200 Calculus of kidney: Secondary | ICD-10-CM | POA: Diagnosis not present

## 2014-12-13 DIAGNOSIS — N132 Hydronephrosis with renal and ureteral calculous obstruction: Secondary | ICD-10-CM | POA: Diagnosis not present

## 2014-12-13 DIAGNOSIS — R312 Other microscopic hematuria: Secondary | ICD-10-CM | POA: Diagnosis not present

## 2014-12-13 LAB — POCT URINALYSIS DIPSTICK
Bilirubin, UA: NEGATIVE
GLUCOSE UA: NEGATIVE
Ketones, UA: NEGATIVE
NITRITE UA: NEGATIVE
PH UA: 7
Protein, UA: NEGATIVE
SPEC GRAV UA: 1.01
UROBILINOGEN UA: NEGATIVE

## 2014-12-13 LAB — POCT UA - MICROSCOPIC ONLY
Bacteria, U Microscopic: NEGATIVE
CRYSTALS, UR, HPF, POC: NEGATIVE
Casts, Ur, LPF, POC: NEGATIVE
Mucus, UA: NEGATIVE
YEAST UA: NEGATIVE

## 2014-12-13 NOTE — Patient Instructions (Signed)
Toradol 30 mg IM if needed--patient is able to give himself an injection and we will have him do this if he continues to have pain. We will call and discuss him with the urologist and try to get him in to be seen today.

## 2014-12-13 NOTE — Progress Notes (Signed)
Subjective:    Patient ID: Joseph Potts, male    DOB: [birth year], 81 y.o.   MRN: [id number]  HPI Patient here today for possible kidney stone, pain and nausea that started last Thursday night, getting worse early this morning. The patient comes to the visit today with his wife. He was awakened this morning with right flank pain and nausea and vomiting. He had an episode of this last week has a history of kidney stones. Last week this happened on Thursday evening on the same side. His bowels are moving okay. He is currently asymptomatic. He prefers to take the injectable Toradol home which she has had before heto get himself a shot of this. We will arrange for him to see the urologist sometime during the day today and have a CT scan of his abdomen.      Patient Active Problem List   Diagnosis Date Noted  . Cervical stenosis of spinal canal 02/16/2014  . Edema 09/26/2011  . PALPITATIONS, RECURRENT 12/16/2008  . HYPERTENSION 12/04/2008  . Pneumothorax 12/04/2008  . SYNCOPE 12/04/2008  . PNEUMOTHORAX 12/04/2008   Outpatient Encounter Prescriptions as of 12/13/2014  Medication Sig  . Alpha-Lipoic Acid 300 MG CAPS Take 1 capsule by mouth daily.  Marland Kitchen aspirin EC 81 MG tablet Take 81 mg by mouth daily.  Marland Kitchen b complex vitamins tablet Take 1 tablet by mouth daily.  Marland Kitchen BIOTIN 5000 PO Take 1 tablet by mouth daily.  . Cholecalciferol (VITAMIN D3) 5000 UNITS TABS Take 5,000 Units by mouth daily.  . Coenzyme Q10 (COQ10) 100 MG CAPS Take 1 capsule by mouth daily.  Marland Kitchen L-Arginine 1000 MG TABS Take 1,000 mg by mouth daily.  . Lutein 20 MG CAPS Take 1 capsule by mouth daily.  . magnesium gluconate (MAGONATE) 500 MG tablet Take 500 mg by mouth daily.  . Misc Natural Products (ADVANCED JOINT RELIEF PO) Take 1 tablet by mouth daily.  . niacinamide 500 MG tablet Take 500 mg by mouth daily.  Marland Kitchen OVER THE COUNTER MEDICATION Take 1 tablet by mouth daily. After 50 dietary supplement  . OVER THE COUNTER MEDICATION  Take 1 tablet by mouth daily. Advance blood pressure support  . selenium 50 MCG TABS Take 200 mcg by mouth daily.  . vitamin C (ASCORBIC ACID) 500 MG tablet Take 1,000 mg by mouth daily.  . vitamin E 400 UNIT capsule Take 400 Units by mouth daily.  Marland Kitchen zinc gluconate 50 MG tablet Take 50 mg by mouth daily.   No facility-administered encounter medications on file as of 12/13/2014.      Review of Systems  Constitutional: Negative.   HENT: Negative.   Eyes: Negative.   Respiratory: Negative.   Cardiovascular: Negative.   Gastrointestinal: Positive for nausea.  Endocrine: Negative.   Genitourinary: Positive for flank pain.  Musculoskeletal: Positive for back pain.  Skin: Negative.   Allergic/Immunologic: Negative.   Neurological: Negative.   Hematological: Negative.   Psychiatric/Behavioral: Negative.        Objective:   Physical Exam  Constitutional: He is oriented to person, place, and time. He appears well-developed and well-nourished. No distress.  HENT:  Head: Normocephalic.  Eyes: Conjunctivae and EOM are normal. Pupils are equal, round, and reactive to light.  Neck: Normal range of motion.  Abdominal: Soft. Bowel sounds are normal. There is no tenderness. There is no rebound and no guarding.  Musculoskeletal: Normal range of motion. He exhibits no edema.  Neurological: He is alert and oriented to person,  place, and time.  Skin: Skin is warm and dry. No rash noted.  Psychiatric: He has a normal mood and affect. His behavior is normal. Thought content normal.  Nursing note and vitals reviewed.  BP 178/79 mmHg  Pulse 49  Temp(Src) 97 F (36.1 C) (Oral)  Ht 6' (1.829 m)  Wt 182 lb (82.555 kg)  BMI 24.68 kg/m2         Assessment & Plan:  1. Ureteral colic -The patient will drink plenty of fluids and we will do our best to get him into see the urologist today so we can have a CT scan to make sure there is no blockage with the stone that he is currently  experiencing. -He is given Toradol to take home for an IM injection he says he has had this before and he tolerates it without problems.  Patient Instructions  Toradol 30 mg IM if needed--patient is able to give himself an injection and we will have him do this if he continues to have pain. We will call and discuss him with the urologist and try to get him in to be seen today.   Arrie Senate MD

## 2014-12-13 NOTE — Addendum Note (Signed)
Addended by: Zannie Cove on: 12/13/2014 08:10 AM   Modules accepted: Orders

## 2014-12-15 LAB — URINE CULTURE

## 2014-12-31 DIAGNOSIS — N23 Unspecified renal colic: Secondary | ICD-10-CM | POA: Diagnosis not present

## 2014-12-31 DIAGNOSIS — N201 Calculus of ureter: Secondary | ICD-10-CM | POA: Diagnosis not present

## 2015-01-21 DIAGNOSIS — N202 Calculus of kidney with calculus of ureter: Secondary | ICD-10-CM | POA: Diagnosis not present

## 2015-01-24 ENCOUNTER — Other Ambulatory Visit: Payer: Self-pay | Admitting: Urology

## 2015-02-10 ENCOUNTER — Encounter (HOSPITAL_BASED_OUTPATIENT_CLINIC_OR_DEPARTMENT_OTHER): Payer: Self-pay | Admitting: *Deleted

## 2015-02-11 ENCOUNTER — Encounter (HOSPITAL_BASED_OUTPATIENT_CLINIC_OR_DEPARTMENT_OTHER): Payer: Self-pay | Admitting: *Deleted

## 2015-02-11 NOTE — Progress Notes (Addendum)
SPOKE W/ WIFE. NPO AFTER MN.  ARRIVE AT 0800.  NEEDS HG.  WILL TAKE RAPAFLO AM DOS W/ SIPS OF WATER.

## 2015-02-16 ENCOUNTER — Encounter (HOSPITAL_BASED_OUTPATIENT_CLINIC_OR_DEPARTMENT_OTHER): Payer: Self-pay | Admitting: Anesthesiology

## 2015-02-16 ENCOUNTER — Ambulatory Visit (HOSPITAL_BASED_OUTPATIENT_CLINIC_OR_DEPARTMENT_OTHER): Admission: RE | Admit: 2015-02-16 | Payer: Medicare Other | Source: Ambulatory Visit | Admitting: Urology

## 2015-02-16 HISTORY — DX: Calculus of ureter: N20.1

## 2015-02-16 HISTORY — DX: Personal history of other diseases of the circulatory system: Z86.79

## 2015-02-16 HISTORY — DX: Personal history of other diseases of the respiratory system: Z87.09

## 2015-02-16 HISTORY — DX: Occlusion and stenosis of bilateral carotid arteries: I65.23

## 2015-02-16 HISTORY — DX: Personal history of other specified conditions: Z87.898

## 2015-02-16 HISTORY — DX: Nonrheumatic aortic (valve) stenosis: I35.0

## 2015-02-16 HISTORY — DX: Personal history of other medical treatment: Z92.89

## 2015-02-16 SURGERY — CYSTOURETEROSCOPY, WITH RETROGRADE PYELOGRAM AND STENT INSERTION
Anesthesia: General | Laterality: Right

## 2015-02-21 DIAGNOSIS — N201 Calculus of ureter: Secondary | ICD-10-CM | POA: Diagnosis not present

## 2016-05-16 ENCOUNTER — Encounter: Payer: Self-pay | Admitting: Family Medicine

## 2016-05-16 ENCOUNTER — Ambulatory Visit (INDEPENDENT_AMBULATORY_CARE_PROVIDER_SITE_OTHER): Payer: Medicare Other | Admitting: Family Medicine

## 2016-05-16 VITALS — BP 104/57 | HR 61 | Temp 97.1°F | Ht 72.0 in | Wt 186.0 lb

## 2016-05-16 DIAGNOSIS — R079 Chest pain, unspecified: Secondary | ICD-10-CM | POA: Diagnosis not present

## 2016-05-16 MED ORDER — NITROGLYCERIN 0.4 MG/SPRAY TL SOLN: 1.0000 | 12 refills | Status: DC | PRN

## 2016-05-16 NOTE — Progress Notes (Signed)
Subjective:    Patient ID: Joseph Potts, male    DOB: May 14, 1932, 83 y.o.   MRN: [id number]  HPI Patient here today for upper left chest pains that started 2-3 days ago. Patient has been working in his yard using hedge tremors. Pain is described as achy nonradiating. It is not generally precipitated by any thing and may come on at rest.  Regarding cardiac risk factors: He has no hypertension, hyperlipidemia, diabetes, and has never smoked. He has seen cardiologist in the past for syncopal episode. There was a question in the very remote past of a heart murmur but it has not been described recently.   Patient Active Problem List   Diagnosis Date Noted  . Cervical stenosis of spinal canal 02/16/2014  . Edema 09/26/2011  . PALPITATIONS, RECURRENT 12/16/2008  . HYPERTENSION 12/04/2008  . Pneumothorax 12/04/2008  . SYNCOPE 12/04/2008  . PNEUMOTHORAX 12/04/2008   Outpatient Encounter Prescriptions as of 05/16/2016  Medication Sig  . Alpha-Lipoic Acid 300 MG CAPS Take 1 capsule by mouth daily.  Marland Kitchen b complex vitamins tablet Take 1 tablet by mouth daily.  . Cholecalciferol (VITAMIN D3) 5000 UNITS TABS Take 5,000 Units by mouth daily.  . Coenzyme Q10 (COQ10) 100 MG CAPS Take 1 capsule by mouth daily.  Marland Kitchen L-Arginine 1000 MG TABS Take 1,000 mg by mouth daily.  . Lutein 20 MG CAPS Take 1 capsule by mouth daily.  . magnesium gluconate (MAGONATE) 500 MG tablet Take 500 mg by mouth daily.  . Misc Natural Products (ADVANCED JOINT RELIEF PO) Take 1 tablet by mouth daily.  . niacinamide 500 MG tablet Take 500 mg by mouth daily.  Marland Kitchen OVER THE COUNTER MEDICATION Take 1 tablet by mouth daily. After 50 dietary supplement  . OVER THE COUNTER MEDICATION Take 1 tablet by mouth daily. Advance blood pressure support  . selenium 50 MCG TABS Take 200 mcg by mouth daily.  . vitamin C (ASCORBIC ACID) 500 MG tablet Take 1,000 mg by mouth daily.  Marland Kitchen zinc gluconate 50 MG tablet Take 50 mg by mouth daily.  .  [DISCONTINUED] aspirin EC 81 MG tablet Take 81 mg by mouth daily.  . [DISCONTINUED] BIOTIN 5000 PO Take 1 tablet by mouth daily.  . [DISCONTINUED] silodosin (RAPAFLO) 4 MG CAPS capsule Take 4 mg by mouth daily with breakfast.  . [DISCONTINUED] vitamin E 400 UNIT capsule Take 400 Units by mouth daily.   No facility-administered encounter medications on file as of 05/16/2016.      Review of Systems  Constitutional: Negative.   HENT: Negative.   Eyes: Negative.   Respiratory: Positive for chest tightness (center left chest). Negative for shortness of breath.   Cardiovascular: Negative.   Gastrointestinal: Negative.   Endocrine: Negative.   Genitourinary: Negative.   Musculoskeletal: Negative.  Negative for arthralgias.  Skin: Negative.   Allergic/Immunologic: Negative.   Neurological: Negative.   Hematological: Negative.   Psychiatric/Behavioral: Negative.        Objective:   Physical Exam  Constitutional: He appears well-developed and well-nourished.  Cardiovascular: Regular rhythm and intact distal pulses.   Murmur: murmur is heard throughout the precordium. Probably grade 2/6. There is no gallop rhythm. Pulmonary/Chest: Effort normal and breath sounds normal. He exhibits no tenderness.   BP (!) 104/57 (BP Location: Left Arm)   Pulse 61   Temp 97.1 F (36.2 C) (Oral)   Ht 6' (1.829 m)   Wt 186 lb (84.4 kg)   BMI 25.23 kg/m  EKG shows sinus bradycardia but no acute changes.     Assessment & Plan:  1. Chest pain, unspecified type Based on risk factors, this is not likely to be ischemic. However that is offset by his sex and age. I explained that I cannot say he does not have any ischemic disease. Echocardiogram shows no changes. The murmur is concerning if it is indeed new. He verbalizes understanding of these findings. I think it is reasonable to give him an Rx for nitroglycerin spray. Depending on how he might respond to that spray we may be more inclined to have him  follow-up with cardiologist, sooner than later. Also suggested baby aspirin if he is not taking one currently Call or return if symptoms change or worsen - EKG 12-Lead  Wardell Honour MD

## 2016-07-13 DIAGNOSIS — H2513 Age-related nuclear cataract, bilateral: Secondary | ICD-10-CM | POA: Diagnosis not present

## 2017-01-28 ENCOUNTER — Ambulatory Visit (INDEPENDENT_AMBULATORY_CARE_PROVIDER_SITE_OTHER): Payer: Medicare Other

## 2017-01-28 ENCOUNTER — Encounter: Payer: Self-pay | Admitting: Family Medicine

## 2017-01-28 ENCOUNTER — Ambulatory Visit (INDEPENDENT_AMBULATORY_CARE_PROVIDER_SITE_OTHER): Payer: Medicare Other | Admitting: Family Medicine

## 2017-01-28 VITALS — BP 121/69 | HR 56 | Temp 97.0°F | Ht 72.0 in | Wt 181.0 lb

## 2017-01-28 DIAGNOSIS — I350 Nonrheumatic aortic (valve) stenosis: Secondary | ICD-10-CM | POA: Diagnosis not present

## 2017-01-28 DIAGNOSIS — R011 Cardiac murmur, unspecified: Secondary | ICD-10-CM | POA: Insufficient documentation

## 2017-01-28 DIAGNOSIS — R109 Unspecified abdominal pain: Secondary | ICD-10-CM

## 2017-01-28 DIAGNOSIS — Z0000 Encounter for general adult medical examination without abnormal findings: Secondary | ICD-10-CM

## 2017-01-28 DIAGNOSIS — I6523 Occlusion and stenosis of bilateral carotid arteries: Secondary | ICD-10-CM

## 2017-01-28 DIAGNOSIS — Z136 Encounter for screening for cardiovascular disorders: Secondary | ICD-10-CM | POA: Diagnosis not present

## 2017-01-28 DIAGNOSIS — M5136 Other intervertebral disc degeneration, lumbar region: Secondary | ICD-10-CM | POA: Diagnosis not present

## 2017-01-28 DIAGNOSIS — I10 Essential (primary) hypertension: Secondary | ICD-10-CM

## 2017-01-28 LAB — URINALYSIS, COMPLETE
Bilirubin, UA: NEGATIVE
Glucose, UA: NEGATIVE
KETONES UA: NEGATIVE
Leukocytes, UA: NEGATIVE
NITRITE UA: NEGATIVE
Protein, UA: NEGATIVE
RBC UA: NEGATIVE
SPEC GRAV UA: 1.025 (ref 1.005–1.030)
UUROB: 0.2 mg/dL (ref 0.2–1.0)
pH, UA: 6.5 (ref 5.0–7.5)

## 2017-01-28 LAB — MICROSCOPIC EXAMINATION
Bacteria, UA: NONE SEEN
Epithelial Cells (non renal): NONE SEEN /hpf (ref 0–10)
RBC MICROSCOPIC, UA: NONE SEEN /HPF (ref 0–?)
Renal Epithel, UA: NONE SEEN /hpf
WBC UA: NONE SEEN /HPF (ref 0–?)

## 2017-01-28 NOTE — Progress Notes (Signed)
Subjective:    Patient ID: Joseph Potts, male    DOB: January 28, 1933, 83 y.o.   MRN: [id number]  HPI Patient here today for possible kidney stone.  The patient has a history of paroxysmal supraventricular tachycardia bilateral common carotid artery disease and aortic valve stenosis.  He has not seen a cardiologist in a good while.  He denies any respiratory symptoms or chest pain.  He denies any trouble with nausea vomiting diarrhea blood in the stool or black tarry bowel movements and has had no urinary tract symptoms.  He is has noticed the left flank pain for 1-2 days and this is always worse with movement.  It does not hurt constantly.  He has not seen any blood in the urine.  It is related to physical activity.  He does not recall any injury that has occurred recently or picking up anything heavy recently other than his normal activity.  Back in 1973 he had a fusion of L4 and 5.  More recently he has had cervical spine fusion.  The initial urinalysis today was clear of red blood cells and infection.     Patient Active Problem List   Diagnosis Date Noted  . Cervical stenosis of spinal canal 02/16/2014  . Edema 09/26/2011  . PALPITATIONS, RECURRENT 12/16/2008  . HYPERTENSION 12/04/2008  . Pneumothorax 12/04/2008  . SYNCOPE 12/04/2008  . PNEUMOTHORAX 12/04/2008   Outpatient Encounter Medications as of 01/28/2017  Medication Sig  . Alpha-Lipoic Acid 300 MG CAPS Take 1 capsule by mouth daily.  . Cholecalciferol (VITAMIN D3) 5000 UNITS TABS Take 5,000 Units by mouth daily.  . Coenzyme Q10 (COQ10) 100 MG CAPS Take 1 capsule by mouth daily.  . Lutein 20 MG CAPS Take 1 capsule by mouth daily.  . magnesium gluconate (MAGONATE) 500 MG tablet Take 500 mg by mouth daily.  . Misc Natural Products (ADVANCED JOINT RELIEF PO) Take 1 tablet by mouth daily.  . niacinamide 500 MG tablet Take 500 mg by mouth daily.  Marland Kitchen OVER THE COUNTER MEDICATION Take 1 tablet by mouth daily. After 50 dietary supplement    . OVER THE COUNTER MEDICATION Take 1 tablet by mouth daily. Advance blood pressure support  . selenium 50 MCG TABS Take 200 mcg by mouth daily.  Marland Kitchen zinc gluconate 50 MG tablet Take 50 mg by mouth daily.  . [DISCONTINUED] b complex vitamins tablet Take 1 tablet by mouth daily.  . [DISCONTINUED] L-Arginine 1000 MG TABS Take 1,000 mg by mouth daily.  . [DISCONTINUED] nitroGLYCERIN (NITROLINGUAL) 0.4 MG/SPRAY spray Place 1 spray under the tongue every 5 (five) minutes x 3 doses as needed for chest pain.  . [DISCONTINUED] vitamin C (ASCORBIC ACID) 500 MG tablet Take 1,000 mg by mouth daily.   No facility-administered encounter medications on file as of 01/28/2017.      Review of Systems  Constitutional: Negative.   HENT: Negative.   Eyes: Negative.   Respiratory: Negative.   Cardiovascular: Negative.   Gastrointestinal: Negative.   Endocrine: Negative.   Genitourinary: Positive for flank pain (left ).  Skin: Negative.   Allergic/Immunologic: Negative.   Neurological: Negative.   Hematological: Negative.   Psychiatric/Behavioral: Negative.        Objective:   Physical Exam  Constitutional: He is oriented to person, place, and time. He appears well-developed and well-nourished.  The patient is alert and doing well.  HENT:  Head: Normocephalic and atraumatic.  Eyes: Conjunctivae and EOM are normal. Pupils are equal, round, and reactive  to light. Right eye exhibits no discharge. Left eye exhibits no discharge. No scleral icterus.  Neck: Normal range of motion. Neck supple. No thyromegaly present.  No adenopathy.  Bilateral bruits apparently radiating from the aortic  stenosis.  Cardiovascular: Normal rate, regular rhythm and normal heart sounds.  No murmur heard. Heart has a regular rate and rhythm at 72/min with a grade 3/6 systolic ejection murmur.  Good inguinal pulses without adenopathy  Pulmonary/Chest: Effort normal and breath sounds normal. No respiratory distress. He has no  wheezes. He has no rales.  Clear anteriorly and posteriorly  Abdominal: Soft. Bowel sounds are normal. He exhibits no mass. There is no tenderness. There is no rebound and no guarding.  No flank tenderness or abdominal masses or organ enlargement or bruits.  Good inguinal pulses.  Musculoskeletal: Normal range of motion. He exhibits no edema.  Leg raising was good bilaterally with good hip abduction without pain.  Lymphadenopathy:    He has no cervical adenopathy.  Neurological: He is alert and oriented to person, place, and time. He has normal reflexes. No cranial nerve deficit.  Skin: Skin is warm and dry. No rash noted.  Psychiatric: He has a normal mood and affect. His behavior is normal. Judgment and thought content normal.  Nursing note and vitals reviewed.   BP 121/69 (BP Location: Left Arm)   Pulse (!) 56   Temp (!) 97 F (36.1 C) (Oral)   Ht 6' (1.829 m)   Wt 181 lb (82.1 kg)   BMI 24.55 kg/m        Assessment & Plan:  1. Lt flank pain -Initial urinalysis was clear and leukocyte negative.  No red blood cells were seen. - Urine Culture - Urinalysis, Complete - DG Abd 1 View; Future - BMP8+EGFR - CBC with Differential/Platelet - DG Lumbar Spine 2-3 Views; Future  2. Healthcare maintenance - Hepatic function panel - Lipid panel - Thyroid Panel With TSH  3. Essential hypertension - Ambulatory referral to Cardiology  4. Heart murmur - Ambulatory referral to Cardiology  5. Bilateral carotid artery stenosis -The patient has bilateral bruits and most likely is a reflection of the aortic stenosis to the neck.  6. Aortic valve stenosis, etiology of cardiac valve disease unspecified -Follow-up appointment with cardiology  Patient Instructions  We will get the x-rays of your back and call you with those results Keep drinking plenty of fluids and stay well-hydrated We will call you with results of the blood work as soon as those results become available Take extra  strength Tylenol if needed for pain We will arrange for you to see the cardiologist because of the systolic ejection murmur and the fact that she have not seen him in almost 5 years.  You may need a repeat echocardiogram. Warm wet compresses would also be helpful on your back.  These could be applied for 20 minutes 3 or 4 times daily. Avoid any heavy lifting pushing or pulling.  Arrie Senate MD

## 2017-01-28 NOTE — Patient Instructions (Signed)
We will get the x-rays of your back and call you with those results Keep drinking plenty of fluids and stay well-hydrated We will call you with results of the blood work as soon as those results become available Take extra strength Tylenol if needed for pain We will arrange for you to see the cardiologist because of the systolic ejection murmur and the fact that she have not seen him in almost 5 years.  You may need a repeat echocardiogram. Warm wet compresses would also be helpful on your back.  These could be applied for 20 minutes 3 or 4 times daily. Avoid any heavy lifting pushing or pulling.

## 2017-01-29 LAB — CBC WITH DIFFERENTIAL/PLATELET
BASOS ABS: 0 10*3/uL (ref 0.0–0.2)
Basos: 0 %
EOS (ABSOLUTE): 0.7 10*3/uL — ABNORMAL HIGH (ref 0.0–0.4)
EOS: 8 %
HEMATOCRIT: 41 % (ref 37.5–51.0)
HEMOGLOBIN: 13.8 g/dL (ref 13.0–17.7)
IMMATURE GRANULOCYTES: 0 %
Immature Grans (Abs): 0 10*3/uL (ref 0.0–0.1)
Lymphocytes Absolute: 1.9 10*3/uL (ref 0.7–3.1)
Lymphs: 20 %
MCH: 32.4 pg (ref 26.6–33.0)
MCHC: 33.7 g/dL (ref 31.5–35.7)
MCV: 96 fL (ref 79–97)
MONOCYTES: 12 %
Monocytes Absolute: 1.1 10*3/uL — ABNORMAL HIGH (ref 0.1–0.9)
NEUTROS PCT: 60 %
Neutrophils Absolute: 5.5 10*3/uL (ref 1.4–7.0)
PLATELETS: 218 10*3/uL (ref 150–379)
RBC: 4.26 x10E6/uL (ref 4.14–5.80)
RDW: 13.3 % (ref 12.3–15.4)
WBC: 9.3 10*3/uL (ref 3.4–10.8)

## 2017-01-29 LAB — BMP8+EGFR
BUN / CREAT RATIO: 23 (ref 10–24)
BUN: 19 mg/dL (ref 8–27)
CO2: 27 mmol/L (ref 20–29)
Calcium: 9.3 mg/dL (ref 8.6–10.2)
Chloride: 101 mmol/L (ref 96–106)
Creatinine, Ser: 0.84 mg/dL (ref 0.76–1.27)
GFR calc Af Amer: 95 mL/min/{1.73_m2} (ref >59)
GFR, EST NON AFRICAN AMERICAN: 82 mL/min/{1.73_m2} (ref >59)
GLUCOSE: 91 mg/dL (ref 65–99)
POTASSIUM: 4.9 mmol/L (ref 3.5–5.2)
SODIUM: 142 mmol/L (ref 134–144)

## 2017-01-29 LAB — HEPATIC FUNCTION PANEL
ALT: 18 IU/L (ref 0–44)
AST: 24 IU/L (ref 0–40)
Albumin: 4.4 g/dL (ref 3.5–4.7)
Alkaline Phosphatase: 70 IU/L (ref 39–117)
BILIRUBIN, DIRECT: 0.14 mg/dL (ref 0.00–0.40)
Bilirubin Total: 0.5 mg/dL (ref 0.0–1.2)
TOTAL PROTEIN: 6.8 g/dL (ref 6.0–8.5)

## 2017-01-29 LAB — THYROID PANEL WITH TSH
Free Thyroxine Index: 1.6 (ref 1.2–4.9)
T3 Uptake Ratio: 31 % (ref 24–39)
T4 TOTAL: 5.3 ug/dL (ref 4.5–12.0)
TSH: 1.89 u[IU]/mL (ref 0.450–4.500)

## 2017-01-29 LAB — LIPID PANEL
CHOLESTEROL TOTAL: 139 mg/dL (ref 100–199)
Chol/HDL Ratio: 3.2 ratio (ref 0.0–5.0)
HDL: 44 mg/dL (ref >39)
LDL CALC: 84 mg/dL (ref 0–99)
TRIGLYCERIDES: 54 mg/dL (ref 0–149)
VLDL CHOLESTEROL CAL: 11 mg/dL (ref 5–40)

## 2017-01-29 LAB — URINE CULTURE

## 2017-05-17 DIAGNOSIS — H02831 Dermatochalasis of right upper eyelid: Secondary | ICD-10-CM | POA: Diagnosis not present

## 2017-05-17 DIAGNOSIS — H25812 Combined forms of age-related cataract, left eye: Secondary | ICD-10-CM | POA: Diagnosis not present

## 2017-05-17 DIAGNOSIS — H04123 Dry eye syndrome of bilateral lacrimal glands: Secondary | ICD-10-CM | POA: Diagnosis not present

## 2017-05-17 DIAGNOSIS — H02834 Dermatochalasis of left upper eyelid: Secondary | ICD-10-CM | POA: Diagnosis not present

## 2017-05-17 DIAGNOSIS — H353111 Nonexudative age-related macular degeneration, right eye, early dry stage: Secondary | ICD-10-CM | POA: Diagnosis not present

## 2017-05-17 DIAGNOSIS — H25811 Combined forms of age-related cataract, right eye: Secondary | ICD-10-CM | POA: Diagnosis not present

## 2017-05-24 DIAGNOSIS — H25812 Combined forms of age-related cataract, left eye: Secondary | ICD-10-CM | POA: Diagnosis not present

## 2017-05-28 NOTE — Progress Notes (Signed)
Cardiology Office Note   Date:  05/29/2017   ID:  Joseph Potts, Joseph Potts 13, 1934, MRN [id number]  PCP:  Chipper Herb, MD  Cardiologist:   No primary care provider on file.   Chief Complaint  Patient presents with  . Heart Murmur      History of Present Illness: Joseph Potts is a 84 y.o. male who presents for follow up of a murmur.  I last saw him in 2013.  He had syncope without a clear etiology.  He had mild AS on echo and moderate carotid stenosis.  He returns for follow up.  He is still quite active.  The patient denies any new symptoms such as chest discomfort, neck or arm discomfort. There has been no new shortness of breath, PND or orthopnea. There have been no reported palpitations, presyncope or syncope.   He still works part time.    Past Medical History:  Diagnosis Date  . Bilateral carotid artery stenosis    per duplex  09-05-2012--  RICA 2-53%,  LICA 66-44%  . Chronic kidney disease   . Heart murmur    systolic per cardiologist note dr Nole Robey  . History of cardiovascular stress test    2004--  per dr Joseph Potts note from 11-19-2006  scan showed ST changes and some questionable slight inferior attenuation with possible mild ischemia felt to be low risk with no further evaluation  . History of paroxysmal supraventricular tachycardia   . History of pneumothorax    2008--  right side spontanenous, chest tube--  resolved  . History of syncope    vasovagal response sitting of pneumothorax  . Kidney stones   . Mild aortic valve stenosis   . Right ureteral calculus     Past Surgical History:  Procedure Laterality Date  . ANAL FISSURE REPAIR  1985  . ANTERIOR CERVICAL DECOMP/DISCECTOMY FUSION N/A 02/16/2014   Procedure: CERVICAL THREE TO FOUR, CERVICAL FOUR TO FIVE, CERVICAL FIVE TO SIX  ANTERIOR CERVICAL DECOMPRESSION/DISKECTOMY/FUSION;  Surgeon: Floyce Stakes, MD;  Location: [facility];  Service: Neurosurgery;  Laterality: N/A;  C3-4 C4-5 C5-6  ANTERIOR CERVICAL DECOMPRESSION/DISKECTOMY/FUSION  . COLONOSCOPY    . FULLTHICKNESS SKIN LESION RESECTION RIGHT LONG/ RING WEB SPACE  03-15-2006  . KNEE ARTHROSCOPY W/ MENISCECTOMY Left 05-18-2010  . LUMBAR DISC SURGERY  1973   L4 -- L5  . TENDON REPAIR  yrs ago   hand laceration  . TRANSTHORACIC ECHOCARDIOGRAM  09-06-2011   mild LVH, grade 1 diastolic dysfunction, ef 03-47%/  mild AV stenosis and mild AR/  trivial MR ,PR and TR     Current Outpatient Medications  Medication Sig Dispense Refill  . Alpha-Lipoic Acid 300 MG CAPS Take 1 capsule by mouth daily.    . Cholecalciferol (VITAMIN D3) 5000 UNITS TABS Take 5,000 Units by mouth daily.    . Coenzyme Q10 (COQ10) 100 MG CAPS Take 1 capsule by mouth daily.    . Lutein 20 MG CAPS Take 1 capsule by mouth daily.    . magnesium gluconate (MAGONATE) 500 MG tablet Take 500 mg by mouth daily.    . Melatonin 10 MG CAPS Take 10 mg by mouth at bedtime.    . niacinamide 500 MG tablet Take 500 mg by mouth daily.    Marland Kitchen selenium 50 MCG TABS Take 200 mcg by mouth daily.     No current facility-administered medications for this visit.     Allergies:   Ibuprofen and Ginger  Social History:  The patient  reports that  has never smoked. he has never used smokeless tobacco. He reports that he does not drink alcohol or use drugs.   Family History:  The patient's family history includes Arrhythmia in his father and mother.    ROS:  Please see the history of present illness.   Otherwise, review of systems are positive for none.   All other systems are reviewed and negative.    PHYSICAL EXAM: VS:  BP 124/76   Pulse (!) 54   Ht 6' (1.829 m)   Wt 183 lb (83 kg)   BMI 24.82 kg/m  , BMI Body mass index is 24.82 kg/m. GENERAL:  Well appearing HEENT:  Pupils equal round and reactive, fundi not visualized, oral mucosa unremarkable NECK:  No jugular venous distention, waveform within normal limits, carotid upstroke brisk and symmetric, bilateral  bruits, no thyromegaly LYMPHATICS:  No cervical, inguinal adenopathy LUNGS:  Clear to auscultation bilaterally BACK:  No CVA tenderness CHEST:  Unremarkable HEART:  PMI not displaced or sustained,S1 and S2 within normal limits, no S3, no S4, no clicks, no rubs, 3/6 apical systolic med peaking murmur, no diastolic  murmurs ABD:  Flat, positive bowel sounds normal in frequency in pitch, no bruits, no rebound, no guarding, no midline pulsatile mass, no hepatomegaly, no splenomegaly EXT:  2 plus pulses throughout, no edema, no cyanosis no clubbing SKIN:  No rashes no nodules NEURO:  Cranial nerves II through XII grossly intact, motor grossly intact throughout PSYCH:  Cognitively intact, oriented to person place and time    EKG:  EKG is ordered today. The ekg ordered today demonstrates Sinus rhythm, rate 54, axis within normal limits, intervals within normal limits, no acute ST-T wave changes.   Recent Labs: 01/28/2017: ALT 18; BUN 19; Creatinine, Ser 0.84; Hemoglobin 13.8; Platelets 218; Potassium 4.9; Sodium 142; TSH 1.890    Lipid Panel    Component Value Date/Time   CHOL 139 01/28/2017 1159   TRIG 54 01/28/2017 1159   HDL 44 01/28/2017 1159   CHOLHDL 3.2 01/28/2017 1159   LDLCALC 84 01/28/2017 1159      Wt Readings from Last 3 Encounters:  05/29/17 183 lb (83 kg)  01/28/17 181 lb (82.1 kg)  05/16/16 186 lb (84.4 kg)      Other studies Reviewed: Additional studies/ records that were reviewed today include: [place]. Review of the above records demonstrates:  Please see elsewhere in the note.     ASSESSMENT AND PLAN:    CAROTID STENOSIS:    He had mild bilateral stenosis in 2014.  He is overdue for follow up and I will arrange this.    AS:  He had mild stenosis in 2013.  I suspect it is moderate and he needs follow up echo.     HTN:  The blood pressure is at target. No change in medications is indicated. We will continue with therapeutic lifestyle changes  (TLC).   Current medicines are reviewed at length with the patient today.  The patient does not have concerns regarding medicines.  The following changes have been made:  no change  Labs/ tests ordered today include:   Orders Placed This Encounter  Procedures  . EKG 12-Lead  . ECHOCARDIOGRAM COMPLETE     Disposition:   FU with me in about 18 months.     Signed, Minus Breeding, MD  05/29/2017 4:56 PM    [facility]

## 2017-05-29 ENCOUNTER — Encounter: Payer: Self-pay | Admitting: Cardiology

## 2017-05-29 ENCOUNTER — Ambulatory Visit (INDEPENDENT_AMBULATORY_CARE_PROVIDER_SITE_OTHER): Payer: Medicare Other | Admitting: Cardiology

## 2017-05-29 VITALS — BP 124/76 | HR 54 | Ht 72.0 in | Wt 183.0 lb

## 2017-05-29 DIAGNOSIS — I10 Essential (primary) hypertension: Secondary | ICD-10-CM

## 2017-05-29 DIAGNOSIS — I350 Nonrheumatic aortic (valve) stenosis: Secondary | ICD-10-CM | POA: Diagnosis not present

## 2017-05-29 DIAGNOSIS — I6523 Occlusion and stenosis of bilateral carotid arteries: Secondary | ICD-10-CM | POA: Diagnosis not present

## 2017-05-29 NOTE — Patient Instructions (Signed)
Medication Instructions:  The current medical regimen is effective;  continue present plan and medications.  Testing/Procedures: Your physician has requested that you have an echocardiogram. Echocardiography is a painless test that uses sound waves to create images of your heart. It provides your doctor with information about the size and shape of your heart and how well your heart's chambers and valves are working. This procedure takes approximately one hour. There are no restrictions for this procedure.  Your physician has requested that you have a carotid duplex. This test is an ultrasound of the carotid arteries in your neck. It looks at blood flow through these arteries that supply the brain with blood. Allow one hour for this exam. There are no restrictions or special instructions.  Follow-Up: Follow up in 18 months with Dr. Percival Spanish.  You will receive a letter in the mail 2 months before you are due.  Please call us when you receive this letter to schedule your follow up appointment.  If you need a refill on your cardiac medications before your next appointment, please call your pharmacy.  Thank you for choosing [facility]!!

## 2017-06-03 DIAGNOSIS — H25812 Combined forms of age-related cataract, left eye: Secondary | ICD-10-CM | POA: Diagnosis not present

## 2017-06-03 DIAGNOSIS — H2512 Age-related nuclear cataract, left eye: Secondary | ICD-10-CM | POA: Diagnosis not present

## 2017-06-14 ENCOUNTER — Ambulatory Visit (HOSPITAL_BASED_OUTPATIENT_CLINIC_OR_DEPARTMENT_OTHER): Payer: Medicare Other

## 2017-06-14 ENCOUNTER — Other Ambulatory Visit: Payer: Self-pay

## 2017-06-14 ENCOUNTER — Ambulatory Visit (HOSPITAL_COMMUNITY)
Admission: RE | Admit: 2017-06-14 | Discharge: 2017-06-14 | Disposition: A | Discharge status: Home / Self Care | Payer: Medicare Other | Source: Ambulatory Visit | Attending: Cardiovascular Disease | Admitting: Cardiovascular Disease

## 2017-06-14 DIAGNOSIS — I429 Cardiomyopathy, unspecified: Secondary | ICD-10-CM | POA: Diagnosis present

## 2017-06-14 DIAGNOSIS — R609 Edema, unspecified: Secondary | ICD-10-CM | POA: Diagnosis not present

## 2017-06-14 DIAGNOSIS — I350 Nonrheumatic aortic (valve) stenosis: Secondary | ICD-10-CM | POA: Diagnosis not present

## 2017-06-14 DIAGNOSIS — R002 Palpitations: Secondary | ICD-10-CM | POA: Insufficient documentation

## 2017-06-14 DIAGNOSIS — I10 Essential (primary) hypertension: Secondary | ICD-10-CM

## 2017-06-14 DIAGNOSIS — I119 Hypertensive heart disease without heart failure: Secondary | ICD-10-CM | POA: Insufficient documentation

## 2017-06-14 DIAGNOSIS — I6523 Occlusion and stenosis of bilateral carotid arteries: Secondary | ICD-10-CM | POA: Insufficient documentation

## 2017-06-14 DIAGNOSIS — I080 Rheumatic disorders of both mitral and aortic valves: Secondary | ICD-10-CM | POA: Diagnosis not present

## 2017-06-18 ENCOUNTER — Telehealth: Payer: Self-pay | Admitting: Cardiology

## 2017-06-18 NOTE — Telephone Encounter (Signed)
Patient called in asking for a call back regarding his Echo/Caritod results. Please call his cell phone.

## 2017-06-18 NOTE — Telephone Encounter (Signed)
Informed patient of results and recommendations... He verbalized understanding.Marland Kitchen

## 2017-06-19 ENCOUNTER — Encounter: Payer: Self-pay | Admitting: *Deleted

## 2017-10-04 DIAGNOSIS — D485 Neoplasm of uncertain behavior of skin: Secondary | ICD-10-CM | POA: Diagnosis not present

## 2017-10-04 DIAGNOSIS — L80 Vitiligo: Secondary | ICD-10-CM | POA: Diagnosis not present

## 2017-10-04 DIAGNOSIS — D225 Melanocytic nevi of trunk: Secondary | ICD-10-CM | POA: Diagnosis not present

## 2017-10-04 DIAGNOSIS — L570 Actinic keratosis: Secondary | ICD-10-CM | POA: Diagnosis not present

## 2017-10-04 DIAGNOSIS — L814 Other melanin hyperpigmentation: Secondary | ICD-10-CM | POA: Diagnosis not present

## 2017-10-04 DIAGNOSIS — L821 Other seborrheic keratosis: Secondary | ICD-10-CM | POA: Diagnosis not present

## 2018-01-17 ENCOUNTER — Telehealth: Payer: Self-pay

## 2018-01-17 ENCOUNTER — Ambulatory Visit (INDEPENDENT_AMBULATORY_CARE_PROVIDER_SITE_OTHER): Payer: Medicare Other | Admitting: Physician Assistant

## 2018-01-17 ENCOUNTER — Encounter: Payer: Self-pay | Admitting: Physician Assistant

## 2018-01-17 VITALS — BP 102/56 | HR 78 | Ht 72.0 in | Wt 174.2 lb

## 2018-01-17 DIAGNOSIS — R002 Palpitations: Secondary | ICD-10-CM | POA: Diagnosis not present

## 2018-01-17 DIAGNOSIS — I6523 Occlusion and stenosis of bilateral carotid arteries: Secondary | ICD-10-CM | POA: Diagnosis not present

## 2018-01-17 DIAGNOSIS — I350 Nonrheumatic aortic (valve) stenosis: Secondary | ICD-10-CM | POA: Diagnosis not present

## 2018-01-17 NOTE — Telephone Encounter (Signed)
Called received from [facility] a nurse at [place] office. Dr.Moore received a call from the pt dentist today. The pt was seen at the dentist yoday and was noted to have a fast irregular heartrate. Dr.Moore is not in his office and instructed his nurse to call us to have the pt seen today, he is concerned that the pt may have a new onset of Afib. The pt sees Dr.Hochrein in [place]. appt scheduled with Almyra Deforest, PA today @2 :30pm. Roselyn Reef will make the pt aware of the appt. She will call make if he will not be able to keep the scheduled appt.

## 2018-01-17 NOTE — Patient Instructions (Addendum)
Your physician recommends that you continue on your current medications as directed. Please refer to the Current Medication list given to you today.  Your physician has recommended that you wear an event monitor. Event monitors are medical devices that record the heart's electrical activity. Doctors most often [place] these monitors to diagnose arrhythmias. Arrhythmias are problems with the speed or rhythm of the heartbeat. The monitor is a small, portable device. You can wear one while you do your normal daily activities. This is usually used to diagnose what is causing palpitations/syncope (passing out).  Joseph Potts    Your physician recommends that you schedule a follow-up appointment in: [name]

## 2018-01-17 NOTE — Progress Notes (Signed)
Cardiology Office Note    Date:  01/19/2018   ID:  Joseph, Potts October 15, 1932, MRN [id number]  PCP:  Joseph Herb, MD  Cardiologist:  Joseph Potts  Chief Complaint  Patient presents with  . Follow-up    seen for Joseph Potts.     History of Present Illness:  Joseph Potts with carotid artery disease, history of heart murmur, and a history of paroxysmal supraventricular tachycardia who presents today for evaluation of palpitation.  He had a stress test in 2004 that showed ST changes and some questionable slightly inferior attenuation was possible mild ischemia however felt to be low risk.  He apparently had unexplained syncope years ago. She also has a remote right-sided spontaneous pneumothorax in 2008.  Carotid Doppler obtained in 2014 showed mild disease on the right, moderate disease on the left.  Patient was last seen by Joseph Potts on 05/29/2017, he was doing very well at the time without any symptoms.  Echocardiogram obtained on 06/14/2017 showed EF 60 to 65%, grade 1 DD, moderate aortic stenosis was trivial regurgitation, mild LAE.  Carotid Doppler obtained on the same day showed less than 39% plaque bilaterally.    Based on telephone note from today, patient's primary care provider received a call from his dentist today.  Apparently when he was seen by his dentist today, he was noted to have a fast irregular heartbeat.  Patient was set up to be seen in the cardiology office today.  [place] he had tachycardia palpitation that lasted over an hour. His HR was in the 140s. He also mentions he has been having this type of tachycardia palpitation once every few weeks.  The symptom was accompanied by dizziness but no chest pain, shortness of breath or feeling of passing out.  By the time he arrived in the cardiology office, he was in normal sinus rhythm by EKG.  I recommended a 30-day event monitor to further assess for  any atrial flutter or atrial fibrillation.  He did have a TSH in November 2018 that was normal.  EKG today does not show any obvious ST-T wave changes.  He can follow-up with Joseph Potts in 2 months to review monitor results.  Note, patient has baseline bradycardia not on any AV nodal blocking agent.  His heart rate is usually in the 50s.  He likely will not be able to tolerate a scheduled rate control medication.  However if he does have atrial fibrillation or atrial flutter, I would recommend as needed dose of short acting diltiazem or beta-blocker.   Past Medical History:  Diagnosis Date  . Bilateral carotid artery stenosis    per duplex  09-05-2012--  RICA 0-63%,  LICA 01-60%  . Chronic kidney disease   . Heart murmur    systolic per cardiologist note dr hochrein  . History of cardiovascular stress test    2004--  per dr Darnell Level brodie note from 11-19-2006  scan showed ST changes and some questionable slight inferior attenuation with possible mild ischemia felt to be low risk with no further evaluation  . History of paroxysmal supraventricular tachycardia   . History of pneumothorax    2008--  right side spontanenous, chest tube--  resolved  . History of syncope    vasovagal response sitting of pneumothorax  . Kidney stones   . Mild aortic valve stenosis   . Right ureteral calculus  Past Surgical History:  Procedure Laterality Date  . ANAL FISSURE REPAIR  1985  . ANTERIOR CERVICAL DECOMP/DISCECTOMY FUSION N/A 02/16/2014   Procedure: CERVICAL THREE TO FOUR, CERVICAL FOUR TO FIVE, CERVICAL FIVE TO SIX  ANTERIOR CERVICAL DECOMPRESSION/DISKECTOMY/FUSION;  Surgeon: Floyce Stakes, MD;  Location: [facility];  Service: Neurosurgery;  Laterality: N/A;  C3-4 C4-5 C5-6 ANTERIOR CERVICAL DECOMPRESSION/DISKECTOMY/FUSION  . COLONOSCOPY    . FULLTHICKNESS SKIN LESION RESECTION RIGHT LONG/ RING WEB SPACE  03-15-2006  . KNEE ARTHROSCOPY W/ MENISCECTOMY Left 05-18-2010  . LUMBAR DISC SURGERY   1973   L4 -- L5  . TENDON REPAIR  yrs ago   hand laceration  . TRANSTHORACIC ECHOCARDIOGRAM  09-06-2011   mild LVH, grade 1 diastolic dysfunction, ef 11-91%/  mild AV stenosis and mild AR/  trivial MR ,PR and TR    Current Medications: Outpatient Medications Prior to Visit  Medication Sig Dispense Refill  . Alpha-Lipoic Acid 300 MG CAPS Take 1 capsule by mouth daily.    Francia Greaves THYROID PO Take by mouth as directed.    Jolyne Loa Grape-Goldenseal (BERBERINE COMPLEX PO) Take by mouth as directed.    . Cholecalciferol (VITAMIN D3) 5000 UNITS TABS Take 5,000 Units by mouth daily.    . Chromium Picolinate (CHROMIUM PICOLATE PO) Take by mouth as directed.    . Lutein 20 MG CAPS Take 1 capsule by mouth daily.    . magnesium gluconate (MAGONATE) 500 MG tablet Take 500 mg by mouth daily.    . niacinamide 500 MG tablet Take 500 mg by mouth daily.    . NON FORMULARY Grapefruit seed extract    . Nutritional Supplements (DHEA PO) Take by mouth as directed.    . nystatin ointment (MYCOSTATIN) Apply 1 application topically.    . selenium 50 MCG TABS Take 200 mcg by mouth daily.    Marland Kitchen UBIQUINOL PO Take by mouth.    Marland Kitchen UNABLE TO FIND Med Name: progenelone?    Marland Kitchen VITAMIN K PO Take by mouth as directed.    Marland Kitchen ZINC GLUCONATE PO Take by mouth as directed.    . Coenzyme Q10 (COQ10) 100 MG CAPS Take 1 capsule by mouth daily.    . Melatonin 10 MG CAPS Take 10 mg by mouth at bedtime.     No facility-administered medications prior to visit.      Allergies:   Ibuprofen and Ginger   Social History   Socioeconomic History  . Marital status: Married    Spouse name: Not on file  . Number of children: 5  . Years of education: Not on file  . Highest education level: Not on file  Occupational History  . Occupation: Pharmacist, community  Social Needs  . Financial resource strain: Not on file  . Food insecurity:    Worry: Not on file    Inability: Not on file  . Transportation needs:    Medical: Not on file     Non-medical: Not on file  Tobacco Use  . Smoking status: Never Smoker  . Smokeless tobacco: Never Used  Substance and Sexual Activity  . Alcohol use: No  . Drug use: No  . Sexual activity: Not on file  Lifestyle  . Physical activity:    Days per week: Not on file    Minutes per session: Not on file  . Stress: Not on file  Relationships  . Social connections:    Talks on phone: Not on file    Gets together: Not  on file    Attends religious service: Not on file    Active member of club or organization: Not on file    Attends meetings of clubs or organizations: Not on file    Relationship status: Not on file  Other Topics Concern  . Not on file  Social History Narrative  . Not on file     Family History:  The patient's family history includes Arrhythmia in his father and mother.   ROS:   Please see the history of present illness.    ROS All other systems reviewed and are negative.   PHYSICAL EXAM:   VS:  BP (!) 102/56   Pulse 78   Ht 6' (1.829 m)   Wt 174 lb 3.2 oz (79 kg)   SpO2 96%   BMI 23.63 kg/m    GEN: Well nourished, well developed, in no acute distress  HEENT: normal  Neck: no JVD, carotid bruits, or masses Cardiac: RRR; no murmurs, rubs, or gallops,no edema  Respiratory:  clear to auscultation bilaterally, normal work of breathing GI: soft, nontender, nondistended, + BS MS: no deformity or atrophy  Skin: warm and dry, no rash Neuro:  Alert and Oriented x 3, Strength and sensation are intact Psych: euthymic mood, full affect  Wt Readings from Last 3 Encounters:  01/17/18 174 lb 3.2 oz (79 kg)  05/29/17 183 lb (83 kg)  01/28/17 181 lb (82.1 kg)      Studies/Labs Reviewed:   EKG:  EKG is ordered today.  The ekg ordered today demonstrates normal sinus rhythm, no significant ST-T wave changes  Recent Labs: 01/28/2017: ALT 18; BUN 19; Creatinine, Ser 0.84; Hemoglobin 13.8; Platelets 218; Potassium 4.9; Sodium 142; TSH 1.890   Lipid Panel      Component Value Date/Time   CHOL 139 01/28/2017 1159   TRIG 54 01/28/2017 1159   HDL 44 01/28/2017 1159   CHOLHDL 3.2 01/28/2017 1159   LDLCALC 84 01/28/2017 1159    Additional studies/ records that were reviewed today include:   Echo 06/14/2017 LV EF: 60% -   65%  ------------------------------------------------------------------- Indications:      Aortic stenosis (I35).  ------------------------------------------------------------------- History:   PMH:   Syncope and murmur.  Aortic valve disease.  Risk factors:  Carotid stenosis. Edema. Palpitation. Hypertension.  ------------------------------------------------------------------- Study Conclusions  - Left ventricle: The cavity size was normal. Wall thickness was   normal. Systolic function was normal. The estimated ejection   fraction was in the range of 60% to 65%. Wall motion was normal;   there were no regional wall motion abnormalities. Doppler   parameters are consistent with abnormal left ventricular   relaxation (grade 1 diastolic dysfunction). - Aortic valve: Valve mobility was moderately restricted. There was   moderate stenosis. There was trivial regurgitation. Mean gradient   (S): 22 mm Hg. Peak gradient (S): 37 mm Hg. - Mitral valve: Calcified annulus. Mildly thickened leaflets .   There was mild regurgitation. - Left atrium: The atrium was mildly dilated.  Impressions:  - Aortic stenosis has worsened since 2013.    Carotid US 06/14/2017 Final Interpretation: Right Carotid: Velocities in the right ICA are consistent with a 1-39% stenosis.        Non-hemodynamically significant plaque <50% noted in the CCA.  Left Carotid: Velocities in the left ICA are consistent with a 1-39% stenosis.       Non-hemodynamically significant plaque noted in the CCA.  Vertebrals: Bilateral vertebral arteries demonstrate antegrade flow. Subclavians: Bilateral subclavian  artery flow was  disturbed.   ASSESSMENT:    1. PALPITATIONS, RECURRENT   2. Moderate aortic stenosis   3. Bilateral carotid artery stenosis      PLAN:  In order of problems listed above:  1. Tachycardia palpitation: He had tachycardia palpitation for longer than an hour this morning.  Heart rate was in the 140s.  He likely has either atrial fibrillation or atrial flutter.  He also has a history of supraventricular tachycardia as well.  I recommended a 30-day event monitor to further assess.  If he does indeed has atrial fibrillation or atrial flutter, he would be a good candidate for NOAC.  2. Moderate aortic stenosis: Seen on echocardiogram in March 2019  3. Bilateral carotid artery disease: Mild disease on last carotid ultrasound.    Medication Adjustments/Labs and Tests Ordered: Current medicines are reviewed at length with the patient today.  Concerns regarding medicines are outlined above.  Medication changes, Labs and Tests ordered today are listed in the Patient Instructions below. Patient Instructions  Your physician recommends that you continue on your current medications as directed. Please refer to the Current Medication list given to you today.  Your physician has recommended that you wear an event monitor. Event monitors are medical devices that record the heart's electrical activity. Doctors most often [place] these monitors to diagnose arrhythmias. Arrhythmias are problems with the speed or rhythm of the heartbeat. The monitor is a small, portable device. You can wear one while you do your normal daily activities. This is usually used to diagnose what is causing palpitations/syncope (passing out).  [place]    Your physician recommends that you schedule a follow-up appointment in: [place], Almyra Deforest, [place]  01/19/2018 11:29 PM    [address], [place], [place]  [address] Phone: [phone]; Fax: [phone]

## 2018-01-19 ENCOUNTER — Encounter: Payer: Self-pay | Admitting: Physician Assistant

## 2018-01-28 ENCOUNTER — Ambulatory Visit (INDEPENDENT_AMBULATORY_CARE_PROVIDER_SITE_OTHER): Payer: Medicare Other

## 2018-01-28 ENCOUNTER — Other Ambulatory Visit: Payer: Self-pay | Admitting: Physician Assistant

## 2018-01-28 DIAGNOSIS — R000 Tachycardia, unspecified: Secondary | ICD-10-CM

## 2018-01-28 DIAGNOSIS — R002 Palpitations: Secondary | ICD-10-CM

## 2018-03-06 DIAGNOSIS — L570 Actinic keratosis: Secondary | ICD-10-CM | POA: Diagnosis not present

## 2018-04-01 NOTE — Progress Notes (Signed)
Cardiology Office Note   Date:  04/02/2018   ID:  Joseph Potts, Joseph Potts Jan 25, 1933, MRN [id number]  PCP:  Chipper Herb, MD  Cardiologist:   No primary care provider on file.   Chief Complaint  Patient presents with  . Palpitations      History of Present Illness: Joseph Potts is a 85 y.o. male who presents for follow up of a murmur.  I last saw him in 2013.  He had syncope without a clear etiology.  He had mild AS on echo and moderate carotid stenosis.  He recently was noted during a dental appt to have a fast heart rate.  He was seen in our clinic. Event monitor demonstrated no arrhythmias.  He said that his heart rate was in the 160s.day.  He is not had that happen again.  He thinks he might have some element of POTS although he is not really describing symptoms consistent with this.  His heart rate does go up but it does not seem to be positional .  He is not describing changes in his blood pressure.  He is bothered by lots of back problems.  Of note he did stop the supplement that he was told might of been increasing his heart rate and he did stop his thyroid replacement.  However, he still has some of the palpitations and rapid heart rate.   Past Medical History:  Diagnosis Date  . Bilateral carotid artery stenosis    per duplex  09-05-2012--  RICA 7-41%,  LICA 28-78%  . Chronic kidney disease   . Heart murmur    systolic per cardiologist note dr Denman Pichardo  . History of cardiovascular stress test    2004--  per dr Darnell Level brodie note from 11-19-2006  scan showed ST changes and some questionable slight inferior attenuation with possible mild ischemia felt to be low risk with no further evaluation  . History of paroxysmal supraventricular tachycardia   . History of pneumothorax    2008--  right side spontanenous, chest tube--  resolved  . History of syncope    vasovagal response sitting of pneumothorax  . Kidney stones   . Mild aortic valve stenosis   . Right ureteral  calculus     Past Surgical History:  Procedure Laterality Date  . ANAL FISSURE REPAIR  1985  . ANTERIOR CERVICAL DECOMP/DISCECTOMY FUSION N/A 02/16/2014   Procedure: CERVICAL THREE TO FOUR, CERVICAL FOUR TO FIVE, CERVICAL FIVE TO SIX  ANTERIOR CERVICAL DECOMPRESSION/DISKECTOMY/FUSION;  Surgeon: Floyce Stakes, MD;  Location: [facility];  Service: Neurosurgery;  Laterality: N/A;  C3-4 C4-5 C5-6 ANTERIOR CERVICAL DECOMPRESSION/DISKECTOMY/FUSION  . COLONOSCOPY    . FULLTHICKNESS SKIN LESION RESECTION RIGHT LONG/ RING WEB SPACE  03-15-2006  . KNEE ARTHROSCOPY W/ MENISCECTOMY Left 05-18-2010  . LUMBAR DISC SURGERY  1973   L4 -- L5  . TENDON REPAIR  yrs ago   hand laceration  . TRANSTHORACIC ECHOCARDIOGRAM  09-06-2011   mild LVH, grade 1 diastolic dysfunction, ef 67-67%/  mild AV stenosis and mild AR/  trivial MR ,PR and TR     Current Outpatient Medications  Medication Sig Dispense Refill  . Alpha-Lipoic Acid 300 MG CAPS Take 1 capsule by mouth daily.    . ARGININE PO Take by mouth daily.    Jolyne Loa Grape-Goldenseal (BERBERINE COMPLEX PO) Take by mouth as directed.    . Cholecalciferol (VITAMIN D3) 5000 UNITS TABS Take 5,000 Units by mouth daily.    Marland Kitchen  Chromium Picolinate (CHROMIUM PICOLATE PO) Take by mouth as directed.    . COLOSTRUM PO Take by mouth daily.    . Cyanocobalamin (B-12 PO) Take by mouth daily.    . Lutein 20 MG CAPS Take 1 capsule by mouth daily.    . magnesium gluconate (MAGONATE) 500 MG tablet Take 500 mg by mouth daily.    Marland Kitchen MEDIUM CHAIN TRIGLYCERIDES PO Take by mouth daily.    . niacinamide 500 MG tablet Take 500 mg by mouth daily.    . NON FORMULARY Grapefruit seed extract    . NON FORMULARY Testosterone cream 1 pump daily    . Nutritional Supplements (DHEA PO) Take by mouth as directed.    . Probiotic Product (PROBIOTIC-10 PO) Take by mouth daily.    Marland Kitchen selenium 50 MCG TABS Take 200 mcg by mouth daily.    Marland Kitchen UBIQUINOL PO Take by mouth.    Marland Kitchen VITAMIN K  PO Take by mouth as directed.    Marland Kitchen ZINC GLUCONATE PO Take by mouth as directed.     No current facility-administered medications for this visit.     Allergies:   Ibuprofen and Ginger    ROS:  Please see the history of present illness.   Otherwise, review of systems are positive for none.   All other systems are reviewed and negative.    PHYSICAL EXAM: VS:  BP 104/60   Pulse 68   Ht 6' (1.829 m)   Wt 171 lb (77.6 kg)   BMI 23.19 kg/m  , BMI Body mass index is 23.19 kg/m. GENERAL:  Well appearing NECK:  No jugular venous distention, waveform within normal limits, carotid upstroke brisk and symmetric, no bruits, no thyromegaly LUNGS:  Clear to auscultation bilaterally CHEST:  Unremarkable HEART:  PMI not displaced or sustained,S1 and S2 within normal limits, no S3, no S4, no clicks, no rubs, 3 out of 6 apical mid peaking systolic murmur, no diastolic murmurs ABD:  Flat, positive bowel sounds normal in frequency in pitch, no bruits, no rebound, no guarding, no midline pulsatile mass, no hepatomegaly, no splenomegaly EXT:  2 plus pulses throughout, no edema, no cyanosis no clubbing  EKG:  EKG is not ordered today.   Recent Labs: No results found for requested labs within last 8760 hours.    Lipid Panel    Component Value Date/Time   CHOL 139 01/28/2017 1159   TRIG 54 01/28/2017 1159   HDL 44 01/28/2017 1159   CHOLHDL 3.2 01/28/2017 1159   LDLCALC 84 01/28/2017 1159      Wt Readings from Last 3 Encounters:  04/02/18 171 lb (77.6 kg)  01/17/18 174 lb 3.2 oz (79 kg)  05/29/17 183 lb (83 kg)      Other studies Reviewed: Additional studies/ records that were reviewed today include: Event monitor. Review of the above records demonstrates:  See elsewhere  ASSESSMENT AND PLAN:  TACHYCARDIA:   He had no orthostatic drop.  There was no elevated blood pressure so I do not suspect POTS.  He was instructed to get and how to use an Alive Cor  CAROTID STENOSIS:    This was  mild this year.  No further imaging is indicated.    AS:  This was moderate in March and had progressed.  He needs follow up echo this March.  I will order this.   HTN:  The blood pressure is at target.  No change in therapy.    Current medicines are reviewed at  length with the patient today.  The patient does not have concerns regarding medicines.  The following changes have been made:  None Labs/ tests ordered today include:   Orders Placed This Encounter  Procedures  . ECHOCARDIOGRAM COMPLETE     Disposition:   FU with me in about 6 months.     Signed, Minus Breeding, MD  04/02/2018 2:29 PM    [place]

## 2018-04-02 ENCOUNTER — Ambulatory Visit (INDEPENDENT_AMBULATORY_CARE_PROVIDER_SITE_OTHER): Payer: Medicare Other | Admitting: Cardiology

## 2018-04-02 ENCOUNTER — Encounter: Payer: Self-pay | Admitting: Cardiology

## 2018-04-02 VITALS — BP 104/60 | HR 68 | Ht 72.0 in | Wt 171.0 lb

## 2018-04-02 DIAGNOSIS — R011 Cardiac murmur, unspecified: Secondary | ICD-10-CM

## 2018-04-02 DIAGNOSIS — R002 Palpitations: Secondary | ICD-10-CM | POA: Diagnosis not present

## 2018-04-02 DIAGNOSIS — I350 Nonrheumatic aortic (valve) stenosis: Secondary | ICD-10-CM | POA: Diagnosis not present

## 2018-04-02 DIAGNOSIS — I10 Essential (primary) hypertension: Secondary | ICD-10-CM | POA: Diagnosis not present

## 2018-04-02 NOTE — Patient Instructions (Signed)
Medication Instructions:  The current medical regimen is effective;  continue present plan and medications.  If you need a refill on your cardiac medications before your next appointment, please call your pharmacy.   Testing/Procedures: Your physician has requested that you have an echocardiogram in March 2020. Echocardiography is a painless test that uses sound waves to create images of your heart. It provides your doctor with information about the size and shape of your heart and how well your heart's chambers and valves are working. This procedure takes approximately one hour. There are no restrictions for this procedure.  Follow-Up: Follow up in 6 months with Dr. Percival Spanish.  You will receive a letter in the mail 2 months before you are due.  Please call us when you receive this letter to schedule your follow up appointment.  Thank you for choosing [facility]!!    [facility]

## 2018-08-05 ENCOUNTER — Ambulatory Visit (HOSPITAL_COMMUNITY): Payer: Medicare Other | Attending: Internal Medicine

## 2018-08-05 ENCOUNTER — Other Ambulatory Visit: Payer: Self-pay

## 2018-08-05 DIAGNOSIS — R011 Cardiac murmur, unspecified: Secondary | ICD-10-CM | POA: Diagnosis not present

## 2018-08-05 DIAGNOSIS — I350 Nonrheumatic aortic (valve) stenosis: Secondary | ICD-10-CM | POA: Diagnosis not present

## 2018-08-05 DIAGNOSIS — I10 Essential (primary) hypertension: Secondary | ICD-10-CM | POA: Diagnosis not present

## 2018-08-12 NOTE — Progress Notes (Signed)
Virtual Visit via Video Note   This visit type was conducted due to national recommendations for restrictions regarding the COVID-19 Pandemic (e.g. social distancing) in an effort to limit this patient's exposure and mitigate transmission in our community.  Due to his co-morbid illnesses, this patient is at least at moderate risk for complications without adequate follow up.  This format is felt to be most appropriate for this patient at this time.  All issues noted in this document were discussed and addressed.  A limited physical exam was performed with this format.  Please refer to the patient's chart for his consent to telehealth for [facility].   Date:  08/13/2018   ID:  Joseph Potts, DOB 02/12/1933, MRN [id number]  Patient Location: Home Provider Location: Home  PCP:  Chipper Herb, MD  Cardiologist:  Dr. Percival Spanish Electrophysiologist:  None   Evaluation Performed:  Follow-Up Visit  Chief Complaint:  Palpitations  History of Present Illness:    Joseph Potts is a 85 y.o. male who presents for follow up of a murmur.  I last saw him in 2013.  He had syncope without a clear etiology.  He had mild AS on echo and moderate carotid stenosis.  He had tachcycardia and wondered if he had POTS.   However, I did not suspect this.  He was going to get an Alive Cor.    He has not use the device since he got it.  He has had some episodes with his heart rate going up to the 140s at rest unprovoked.  It might last for couple of minutes.  However, he has not recorded this.  He denies any presyncope or syncope.  He has not had any shortness of breath, PND or orthopnea.  He has had no weight gain or edema.  Of interest I did do his echocardiogram which demonstrated well-preserved left ventricular function with a normal EF.  However, the right ventricle systolic pressure was normal but with severely reduced systolic function.  This was new compared with a previous echo in March of last year.   His aortic valve which had some mild sclerosis and stiffness was unchanged.  The patient does not have symptoms concerning for COVID-19 infection (fever, chills, cough, or new shortness of breath).    Past Medical History:  Diagnosis Date  . Bilateral carotid artery stenosis    per duplex  09-05-2012--  RICA 8-34%,  LICA 19-62%  . Chronic kidney disease   . Heart murmur    systolic per cardiologist note dr Coline Calkin  . History of cardiovascular stress test    2004--  per dr Darnell Level brodie note from 11-19-2006  scan showed ST changes and some questionable slight inferior attenuation with possible mild ischemia felt to be low risk with no further evaluation  . History of paroxysmal supraventricular tachycardia   . History of pneumothorax    2008--  right side spontanenous, chest tube--  resolved  . History of syncope    vasovagal response sitting of pneumothorax  . Kidney stones   . Mild aortic valve stenosis   . Right ureteral calculus    Past Surgical History:  Procedure Laterality Date  . ANAL FISSURE REPAIR  1985  . ANTERIOR CERVICAL DECOMP/DISCECTOMY FUSION N/A 02/16/2014   Procedure: CERVICAL THREE TO FOUR, CERVICAL FOUR TO FIVE, CERVICAL FIVE TO SIX  ANTERIOR CERVICAL DECOMPRESSION/DISKECTOMY/FUSION;  Surgeon: Floyce Stakes, MD;  Location: [facility];  Service: Neurosurgery;  Laterality: N/A;  C3-4  C4-5 C5-6 ANTERIOR CERVICAL DECOMPRESSION/DISKECTOMY/FUSION  . COLONOSCOPY    . FULLTHICKNESS SKIN LESION RESECTION RIGHT LONG/ RING WEB SPACE  03-15-2006  . KNEE ARTHROSCOPY W/ MENISCECTOMY Left 05-18-2010  . LUMBAR DISC SURGERY  1973   L4 -- L5  . TENDON REPAIR  yrs ago   hand laceration  . TRANSTHORACIC ECHOCARDIOGRAM  09-06-2011   mild LVH, grade 1 diastolic dysfunction, ef 24-46%/  mild AV stenosis and mild AR/  trivial MR ,PR and TR     Current Meds  Medication Sig  . Alpha-Lipoic Acid 300 MG CAPS Take 1 capsule by mouth daily.  Jolyne Loa Grape-Goldenseal  (BERBERINE COMPLEX PO) Take by mouth as directed.  . Beta Glucan POWD Take by mouth daily.  . Cholecalciferol (VITAMIN D3) 5000 UNITS TABS Take 5,000 Units by mouth daily.  . COLOSTRUM PO Take by mouth daily.  . Cyanocobalamin (B-12 PO) Take by mouth daily.  . magnesium gluconate (MAGONATE) 500 MG tablet Take 500 mg by mouth daily.  Marland Kitchen MEDIUM CHAIN TRIGLYCERIDES PO Take by mouth daily.  . niacinamide 500 MG tablet Take 500 mg by mouth daily.  . NON FORMULARY Grapefruit seed extract  . NON FORMULARY Testosterone cream 1 pump daily  . Nutritional Supplements (DHEA PO) Take by mouth as directed.  . Probiotic Product (PROBIOTIC-10 PO) Take by mouth daily.  Marland Kitchen selenium 50 MCG TABS Take 200 mcg by mouth daily.  Marland Kitchen UBIQUINOL PO Take by mouth.  Marland Kitchen UNABLE TO FIND Take by mouth daily. Eye Defense  . UNABLE TO FIND Take by mouth daily. Essential Fatty Acids  . UNABLE TO FIND Take by mouth daily. Imortalium  . VITAMIN K PO Take by mouth as directed.  Marland Kitchen ZINC GLUCONATE PO Take by mouth as directed.     Allergies:   Ibuprofen and Ginger   Social History   Tobacco Use  . Smoking status: Never Smoker  . Smokeless tobacco: Never Used  Substance Use Topics  . Alcohol use: No  . Drug use: No     Family Hx: The patient's family history includes Arrhythmia in his father and mother.  ROS:   Please see the history of present illness.    As stated in the HPI and negative for all other systems.   Prior CV studies:   The following studies were reviewed today:  Echo  Labs/Other Tests and Data Reviewed:    EKG:  No ECG reviewed.  Recent Labs: No results found for requested labs within last 8760 hours.   Recent Lipid Panel Potts Results  Component Value Date/Time   CHOL 139 01/28/2017 11:59 AM   TRIG 54 01/28/2017 11:59 AM   HDL 44 01/28/2017 11:59 AM   CHOLHDL 3.2 01/28/2017 11:59 AM   LDLCALC 84 01/28/2017 11:59 AM    Wt Readings from Last 3 Encounters:  08/13/18 162 lb (73.5 kg)   04/02/18 171 lb (77.6 kg)  01/17/18 174 lb 3.2 oz (79 kg)     Objective:    Vital Signs:  BP 116/65 (BP Location: Left Arm, Patient Position: Sitting, Cuff Size: Normal)   Pulse (!) 51   Ht 6' (1.829 m)   Wt 162 lb (73.5 kg)   BMI 21.97 kg/m    VITAL SIGNS:  reviewed  ASSESSMENT & PLAN:     TACHYCARDIA:     He is going to work on getting the some recordings of this further management.     RV DYSFUNCTION:  This does not fit clinically with  anything that he is reporting.  He is not having any signs or symptoms of RV failure.  He was not at risk for pulmonary embolism.  I am going to send him for a chest x-ray.  I will see him back in the office to further evaluate.  I will likely follow-up with another echo in the near future.  CAROTID STENOSIS:     This was mild.  No change in therapy or further imaging.   AS:   This was mild to moderate I will follow this clinically.  HTN:  The blood pressure at target no change in therapy.  Time:   Today, I have spent 16 minutes with the patient with telehealth technology discussing the above problems.     Medication Adjustments/Labs and Tests Ordered: Current medicines are reviewed at length with the patient today.  Concerns regarding medicines are outlined above.   Tests Ordered: No orders of the defined types were placed in this encounter.   Medication Changes: No orders of the defined types were placed in this encounter.   Disposition:  Follow up with me in the [place] office in one month  Signed, Minus Breeding, MD  08/13/2018 11:46 AM    [place]

## 2018-08-13 ENCOUNTER — Encounter: Payer: Self-pay | Admitting: Cardiology

## 2018-08-13 ENCOUNTER — Other Ambulatory Visit: Payer: Self-pay

## 2018-08-13 ENCOUNTER — Telehealth: Payer: Self-pay | Admitting: *Deleted

## 2018-08-13 ENCOUNTER — Telehealth (INDEPENDENT_AMBULATORY_CARE_PROVIDER_SITE_OTHER): Payer: Medicare Other | Admitting: Cardiology

## 2018-08-13 VITALS — BP 116/65 | HR 51 | Ht 72.0 in | Wt 162.0 lb

## 2018-08-13 DIAGNOSIS — R931 Abnormal findings on diagnostic imaging of heart and coronary circulation: Secondary | ICD-10-CM

## 2018-08-13 DIAGNOSIS — R002 Palpitations: Secondary | ICD-10-CM

## 2018-08-13 NOTE — Telephone Encounter (Signed)
Dr Percival Spanish had an appt with patient today and ordered him to have a CXR and a 1 month f/u appt with him in the [facility] office.  After checking pt can have CXR at [facility] as a walk in.  Dr Percival Spanish is seeing pt's at the [place] office as DOD 6/11 and then not again until 7/17.  Pt needs to be scheduled in f/u as ordered by Dr Percival Spanish.

## 2018-08-13 NOTE — Patient Instructions (Signed)
Medication Instructions:  The current medical regimen is effective;  continue present plan and medications.  If you need a refill on your cardiac medications before your next appointment, please call your pharmacy.   Testing/Procedures: You have been ordered to have a chest X-ray and will be called to be scheduled for this.  Follow-Up: Follow up in 1 month with Dr Percival Spanish in [place].  Thank you for choosing [facility]!!

## 2018-08-20 ENCOUNTER — Other Ambulatory Visit (INDEPENDENT_AMBULATORY_CARE_PROVIDER_SITE_OTHER): Payer: Medicare Other

## 2018-08-20 ENCOUNTER — Other Ambulatory Visit: Payer: Self-pay

## 2018-08-20 ENCOUNTER — Telehealth: Payer: Self-pay | Admitting: Cardiology

## 2018-08-20 DIAGNOSIS — R931 Abnormal findings on diagnostic imaging of heart and coronary circulation: Secondary | ICD-10-CM

## 2018-08-20 NOTE — Telephone Encounter (Signed)
Notes recorded by Minus Breeding, MD on 08/20/2018 at 12:41 PM EDT No acute findings on CXR  Call Mr. Winemiller with the results and send results to Chipper Herb, MD  Notes recorded by Michae Kava, CMA on 08/20/2018 at 3:31 PM EDT DPR ok to leave message on phone. Left message per Dr. Percival Spanish cxr no acute findings. If any questions please call [phone]. I will forward results to PCP Dr. Redge Gainer. ------  Pt is scheduled for f/u at [facility] with Dr Percival Spanish 09/04/2018.

## 2018-08-20 NOTE — Telephone Encounter (Signed)
MyChart msg sent.

## 2018-09-03 NOTE — Progress Notes (Signed)
Cardiology Office Note   Date:  09/04/2018   ID:  Joseph Eaton., DOB 28-Oct-1932, MRN [id number]  PCP:  Chipper Herb, MD  Cardiologist:   No primary care provider on file.   No chief complaint on file.     History of Present Illness: Joseph Saleeby. is a 85 y.o. male who presents for follow up of a murmur. I last saw him in 2013. He had syncope without a clear etiology. He had mild AS on echo and moderate carotid stenosis.  He had tachcycardia and wondered if he had POTS.   However, I did not suspect this.  He got an Alive Cor.  Of interest I did do his echocardiogram which demonstrated well-preserved left ventricular function with a normal EF.  However, the right ventricle systolic pressure was normal but with severely reduced systolic function.  This was new compared with a previous echo in March of last year.  His aortic valve which had some mild sclerosis and stiffness was unchanged.  I did order a CXR which was unremarkable.    He presents today for follow up and says that he feels quite well.  He says he has had an intentional 25 lb weight loss and feels great because of this.   The patient denies any new symptoms such as chest discomfort, neck or arm discomfort. There has been no new shortness of breath, PND or orthopnea. There have been no reported presyncope or syncope.  He has had occasional palpitations and I reviewed his strips and there is some sinus tachycardia recorded.  However, there is no evidence of atrial fibrillation.  This is narrow complex.  I do not see the onset and offset.  He said that as opposed to previous episodes this is been short lasting several minutes at a time.  He is tried some maneuvers to get rid of it and it goes away on its own.  Past Medical History:  Diagnosis Date  . Bilateral carotid artery stenosis    per duplex  09-05-2012--  RICA 2-83%,  LICA 15-17%  . Chronic kidney disease   . Heart murmur    systolic per cardiologist note  dr Davi Kroon  . History of cardiovascular stress test    2004--  per dr Darnell Level brodie note from 11-19-2006  scan showed ST changes and some questionable slight inferior attenuation with possible mild ischemia felt to be low risk with no further evaluation  . History of paroxysmal supraventricular tachycardia   . History of pneumothorax    2008--  right side spontanenous, chest tube--  resolved  . History of syncope    vasovagal response sitting of pneumothorax  . Kidney stones   . Mild aortic valve stenosis   . Right ureteral calculus     Past Surgical History:  Procedure Laterality Date  . ANAL FISSURE REPAIR  1985  . ANTERIOR CERVICAL DECOMP/DISCECTOMY FUSION N/A 02/16/2014   Procedure: CERVICAL THREE TO FOUR, CERVICAL FOUR TO FIVE, CERVICAL FIVE TO SIX  ANTERIOR CERVICAL DECOMPRESSION/DISKECTOMY/FUSION;  Surgeon: Floyce Stakes, MD;  Location: [facility];  Service: Neurosurgery;  Laterality: N/A;  C3-4 C4-5 C5-6 ANTERIOR CERVICAL DECOMPRESSION/DISKECTOMY/FUSION  . COLONOSCOPY    . FULLTHICKNESS SKIN LESION RESECTION RIGHT LONG/ RING WEB SPACE  03-15-2006  . KNEE ARTHROSCOPY W/ MENISCECTOMY Left 05-18-2010  . LUMBAR DISC SURGERY  1973   L4 -- L5  . TENDON REPAIR  yrs ago   hand laceration  . TRANSTHORACIC ECHOCARDIOGRAM  09-06-2011   mild LVH, grade 1 diastolic dysfunction, ef 41-74%/  mild AV stenosis and mild AR/  trivial MR ,PR and TR     Current Outpatient Medications  Medication Sig Dispense Refill  . Alpha-Lipoic Acid 300 MG CAPS Take 1 capsule by mouth daily.    . ARGININE PO Take by mouth daily.    Jolyne Loa Grape-Goldenseal (BERBERINE COMPLEX PO) Take by mouth as directed.    . Beta Glucan POWD Take by mouth daily.    . Cholecalciferol (VITAMIN D3) 5000 UNITS TABS Take 5,000 Units by mouth daily.    . Chromium Picolinate (CHROMIUM PICOLATE PO) Take by mouth as directed.    . COLOSTRUM PO Take by mouth daily.    . Cyanocobalamin (B-12 PO) Take by mouth daily.     . Lutein 20 MG CAPS Take 1 capsule by mouth daily.    . magnesium gluconate (MAGONATE) 500 MG tablet Take 500 mg by mouth daily.    Marland Kitchen MEDIUM CHAIN TRIGLYCERIDES PO Take by mouth daily.    . niacinamide 500 MG tablet Take 500 mg by mouth daily.    . NON FORMULARY Grapefruit seed extract    . NON FORMULARY Testosterone cream 1 pump daily    . Nutritional Supplements (DHEA PO) Take by mouth as directed.    . Probiotic Product (PROBIOTIC-10 PO) Take by mouth daily.    Marland Kitchen selenium 50 MCG TABS Take 200 mcg by mouth daily.    Marland Kitchen UBIQUINOL PO Take by mouth.    Marland Kitchen UNABLE TO FIND Take by mouth daily. Eye Defense    . UNABLE TO FIND Take by mouth daily. Essential Fatty Acids    . UNABLE TO FIND Take by mouth daily. Imortalium    . VITAMIN K PO Take by mouth as directed.    Marland Kitchen ZINC GLUCONATE PO Take by mouth as directed.     No current facility-administered medications for this visit.     Allergies:   Ibuprofen and Ginger    ROS:  Please see the history of present illness.   Otherwise, review of systems are positive for none.   All other systems are reviewed and negative.    PHYSICAL EXAM: VS:  BP (!) 154/70   Pulse 60   Temp 97.8 F (36.6 C)   Ht 5\' 11"  (1.803 m)   Wt 166 lb (75.3 kg)   SpO2 97%   BMI 23.15 kg/m  , BMI Body mass index is 23.15 kg/m. GENERAL:  Well appearing NECK:  No jugular venous distention, waveform within normal limits, carotid upstroke brisk and symmetric, no bruits, no thyromegaly LUNGS:  Clear to auscultation bilaterally CHEST:  Unremarkable HEART:  PMI not displaced or sustained,S1 and S2 within normal limits, no S3, no S4, no clicks, no rubs, 3 out of 6 apical systolic murmur at least mid peaking, no diastolic murmurs ABD:  Flat, positive bowel sounds normal in frequency in pitch, no bruits, no rebound, no guarding, no midline pulsatile mass, no hepatomegaly, no splenomegaly EXT:  2 plus pulses throughout, no edema, no cyanosis no clubbing   EKG:  EKG is  ordered today. The ekg ordered today demonstrates sinus rhythm, rate 60, axis within normal limits, intervals within normal limits, no acute ST-T wave changes.   Recent Labs: No results found for requested labs within last 8760 hours.    Lipid Panel    Component Value Date/Time   CHOL 139 01/28/2017 1159   TRIG 54 01/28/2017 1159  HDL 44 01/28/2017 1159   CHOLHDL 3.2 01/28/2017 1159   LDLCALC 84 01/28/2017 1159      Wt Readings from Last 3 Encounters:  09/04/18 166 lb (75.3 kg)  08/13/18 162 lb (73.5 kg)  04/02/18 171 lb (77.6 kg)      Other studies Reviewed: Additional studies/ records that were reviewed today include: Echo. Review of the above records demonstrates:  Please see elsewhere in the note.     ASSESSMENT AND PLAN:  TACHYCARDIA: He does not have evidence of atrial fibrillation which was the main issue.  There is some sinus tachycardia but he is not particularly symptomatic and would not want management of this.  It does seem to happen in a standing position and could be related to the finding below but he is not particularly symptomatic and his physical exam is pretty unremarkable.  He will let me know if his symptoms worsen or if he has any further episodes such as presyncope or syncope.  RV DYSFUNCTION:   Of note he mentions that years ago he saw pulmonologist who said there was something wrong in his chest x-ray with his pulmonary artery but he is never had any issues and never had any follow-up and I do not see any evidence of pulmonary artery stenosis or other findings.  He is asymptomatic and does not have much on exam.  He is never had RV dysfunction mentioned before.  I am going to follow-up with another echocardiogram in 6 months or sooner if needed.  CAROTID STENOSIS: This was mild.    AS:  This is at least moderate and will be followed up as above.  Again he reports no symptoms.  HTN: The blood pressure is at target.  No change in therapy.    WEIGHT LOSS: He mentioned this to me and said he has been trying to lose weight and he feels great.  However, he does look thinner and I brought this up with Dr. Laurance Flatten.  He will let me know if he has continued weight loss.   Current medicines are reviewed at length with the patient today.  The patient does not have concerns regarding medicines.  The following changes have been made:  no change  Labs/ tests ordered today include:  No orders of the defined types were placed in this encounter.    Disposition:   FU with me after the next echo in six months.     Signed, Minus Breeding, MD  09/04/2018 10:41 AM    [facility]

## 2018-09-04 ENCOUNTER — Other Ambulatory Visit: Payer: Self-pay

## 2018-09-04 ENCOUNTER — Encounter: Payer: Self-pay | Admitting: Cardiology

## 2018-09-04 ENCOUNTER — Ambulatory Visit (INDEPENDENT_AMBULATORY_CARE_PROVIDER_SITE_OTHER): Payer: Medicare Other | Admitting: Cardiology

## 2018-09-04 VITALS — BP 154/70 | HR 60 | Temp 97.8°F | Ht 71.0 in | Wt 166.0 lb

## 2018-09-04 DIAGNOSIS — I519 Heart disease, unspecified: Secondary | ICD-10-CM | POA: Diagnosis not present

## 2018-09-04 NOTE — Patient Instructions (Signed)
Medication Instructions:  Continue current medications  If you need a refill on your cardiac medications before your next appointment, please call your pharmacy.  Labwork: None ordered   Testing/Procedures: Your physician has requested that you have an echocardiogram in 6 months. Echocardiography is a painless test that uses sound waves to create images of your heart. It provides your doctor with information about the size and shape of your heart and how well your heart's chambers and valves are working. This procedure takes approximately one hour. There are no restrictions for this procedure.  Follow-Up: You will need a follow up appointment in 6 months.  Please call our office 2 months in advance to schedule this appointment.  You may see No primary care provider on file. or one of the following Advanced Practice Providers on your designated Care Team:   Rosaria Ferries, PA-C . Jory Sims, DNP, ANP    , Jory Sims, DNP, [place], you and your health needs are our priority.  As part of our continuing mission to provide you with exceptional heart care, we have created designated Provider Care Teams.  These Care Teams include your primary Cardiologist (physician) and Advanced Practice Providers (APPs -  Physician Assistants and Nurse Practitioners) who all work together to provide you with the care you need, when you need it.  Thank you for choosing [facility] at [facility]!!

## 2018-09-05 ENCOUNTER — Telehealth: Payer: Self-pay | Admitting: *Deleted

## 2018-09-05 NOTE — Telephone Encounter (Signed)
LM 6/12-jhb 

## 2018-09-05 NOTE — Telephone Encounter (Signed)
-----   Message from Chipper Herb, MD sent at 09/04/2018 12:35 PM EDT ----- Regarding: FW: See message from Dr. Percival Spanish. Make sure patient has had recent chest x-ray and lab work to include CBC, LFTs, BMP, thyroid, u/a, FOBT. Also get CT scan of abdomen and make sure that he is up-to-date on colonoscopies. ----- Message ----- From: Minus Breeding, MD Sent: 09/04/2018  12:24 PM EDT To: Chipper Herb, MD  [name],  I am worried about his 25 lb weight loss.  He has this abnormal RV dysfunction that I cannot explain and it does not correlate with any physical findings or symptoms.  I am going to keep an eye on this.  He is very dismissive of things and just mentioned the weight loss and I am concerned.  Maylon Cos

## 2018-09-22 DIAGNOSIS — H25811 Combined forms of age-related cataract, right eye: Secondary | ICD-10-CM | POA: Diagnosis not present

## 2018-09-22 DIAGNOSIS — H04123 Dry eye syndrome of bilateral lacrimal glands: Secondary | ICD-10-CM | POA: Diagnosis not present

## 2018-09-22 DIAGNOSIS — Z961 Presence of intraocular lens: Secondary | ICD-10-CM | POA: Diagnosis not present

## 2018-09-23 ENCOUNTER — Other Ambulatory Visit: Payer: Medicare Other

## 2018-09-23 ENCOUNTER — Other Ambulatory Visit: Payer: Self-pay | Admitting: Family Medicine

## 2018-09-23 ENCOUNTER — Ambulatory Visit (INDEPENDENT_AMBULATORY_CARE_PROVIDER_SITE_OTHER): Payer: Medicare Other

## 2018-09-23 ENCOUNTER — Other Ambulatory Visit: Payer: Self-pay | Admitting: *Deleted

## 2018-09-23 ENCOUNTER — Other Ambulatory Visit: Payer: Self-pay

## 2018-09-23 ENCOUNTER — Ambulatory Visit (HOSPITAL_COMMUNITY)
Admission: RE | Admit: 2018-09-23 | Discharge: 2018-09-23 | Disposition: A | Discharge status: Home / Self Care | Payer: Medicare Other | Source: Ambulatory Visit | Attending: Family Medicine | Admitting: Family Medicine

## 2018-09-23 ENCOUNTER — Telehealth: Payer: Self-pay | Admitting: Family Medicine

## 2018-09-23 DIAGNOSIS — R011 Cardiac murmur, unspecified: Secondary | ICD-10-CM

## 2018-09-23 DIAGNOSIS — R634 Abnormal weight loss: Secondary | ICD-10-CM | POA: Diagnosis not present

## 2018-09-23 DIAGNOSIS — M47816 Spondylosis without myelopathy or radiculopathy, lumbar region: Secondary | ICD-10-CM | POA: Diagnosis not present

## 2018-09-23 DIAGNOSIS — N400 Enlarged prostate without lower urinary tract symptoms: Secondary | ICD-10-CM | POA: Diagnosis not present

## 2018-09-23 DIAGNOSIS — I6523 Occlusion and stenosis of bilateral carotid arteries: Secondary | ICD-10-CM

## 2018-09-23 DIAGNOSIS — R310 Gross hematuria: Secondary | ICD-10-CM

## 2018-09-23 DIAGNOSIS — K769 Liver disease, unspecified: Secondary | ICD-10-CM

## 2018-09-23 DIAGNOSIS — N200 Calculus of kidney: Secondary | ICD-10-CM

## 2018-09-23 DIAGNOSIS — I10 Essential (primary) hypertension: Secondary | ICD-10-CM

## 2018-09-23 DIAGNOSIS — I77811 Abdominal aortic ectasia: Secondary | ICD-10-CM | POA: Diagnosis not present

## 2018-09-23 DIAGNOSIS — K7689 Other specified diseases of liver: Secondary | ICD-10-CM | POA: Diagnosis not present

## 2018-09-23 DIAGNOSIS — R6889 Other general symptoms and signs: Secondary | ICD-10-CM | POA: Diagnosis not present

## 2018-09-23 DIAGNOSIS — N132 Hydronephrosis with renal and ureteral calculous obstruction: Secondary | ICD-10-CM | POA: Diagnosis not present

## 2018-09-23 DIAGNOSIS — Z1159 Encounter for screening for other viral diseases: Secondary | ICD-10-CM | POA: Diagnosis not present

## 2018-09-23 DIAGNOSIS — N2889 Other specified disorders of kidney and ureter: Secondary | ICD-10-CM

## 2018-09-23 DIAGNOSIS — K8020 Calculus of gallbladder without cholecystitis without obstruction: Secondary | ICD-10-CM | POA: Diagnosis not present

## 2018-09-23 DIAGNOSIS — Z1211 Encounter for screening for malignant neoplasm of colon: Secondary | ICD-10-CM

## 2018-09-23 LAB — URINALYSIS, COMPLETE
Bilirubin, UA: NEGATIVE
Glucose, UA: NEGATIVE
Leukocytes,UA: NEGATIVE
Nitrite, UA: NEGATIVE
Specific Gravity, UA: 1.025 (ref 1.005–1.030)
Urobilinogen, Ur: 0.2 mg/dL (ref 0.2–1.0)
pH, UA: 5.5 (ref 5.0–7.5)

## 2018-09-23 LAB — MICROSCOPIC EXAMINATION
Bacteria, UA: NONE SEEN
Epithelial Cells (non renal): NONE SEEN /hpf (ref 0–10)
RBC, Urine: >30 /hpf — AB (ref 0–2)
Renal Epithel, UA: NONE SEEN /hpf
WBC, UA: NONE SEEN /hpf (ref 0–5)

## 2018-09-23 LAB — POCT I-STAT CREATININE: Creatinine, Ser: 0.9 mg/dL (ref 0.61–1.24)

## 2018-09-23 MED ORDER — IOHEXOL 300 MG/ML  SOLN
150.0000 mL | Freq: Once | INTRAMUSCULAR | Status: AC | PRN
  Administered 2018-09-23: 15:00:00 125 mL via INTRAVENOUS

## 2018-09-23 NOTE — Telephone Encounter (Signed)
Pt called about labs

## 2018-09-23 NOTE — Telephone Encounter (Signed)
-----   Message from Chipper Herb, MD sent at 09/04/2018 12:35 PM EDT ----- Regarding: FW: See message from Dr. Percival Spanish. Make sure patient has had recent chest x-ray and lab work to include CBC, LFTs, BMP, thyroid, u/a, FOBT. Also get CT scan of abdomen and make sure that he is up-to-date on colonoscopies. ----- Message ----- From: Minus Breeding, MD Sent: 09/04/2018  12:24 PM EDT To: Chipper Herb, MD  [name],  I am worried about his 25 lb weight loss.  He has this abnormal RV dysfunction that I cannot explain and it does not correlate with any physical findings or symptoms.  I am going to keep an eye on this.  He is very dismissive of things and just mentioned the weight loss and I am concerned.  Joseph Potts

## 2018-09-23 NOTE — Telephone Encounter (Signed)
Pt called in today and will come by today for cxr and lab.  We will hold the CT order until all labs return.  - jhb 6/30-20.

## 2018-09-24 ENCOUNTER — Ambulatory Visit (HOSPITAL_COMMUNITY)
Admission: RE | Admit: 2018-09-24 | Discharge: 2018-09-24 | Disposition: A | Discharge status: Home / Self Care | Payer: Medicare Other | Source: Ambulatory Visit | Attending: Family Medicine | Admitting: Family Medicine

## 2018-09-24 DIAGNOSIS — N134 Hydroureter: Secondary | ICD-10-CM | POA: Diagnosis not present

## 2018-09-24 DIAGNOSIS — N202 Calculus of kidney with calculus of ureter: Secondary | ICD-10-CM | POA: Diagnosis not present

## 2018-09-24 DIAGNOSIS — R634 Abnormal weight loss: Secondary | ICD-10-CM | POA: Diagnosis not present

## 2018-09-24 DIAGNOSIS — K769 Liver disease, unspecified: Secondary | ICD-10-CM | POA: Diagnosis not present

## 2018-09-24 DIAGNOSIS — K8020 Calculus of gallbladder without cholecystitis without obstruction: Secondary | ICD-10-CM | POA: Diagnosis not present

## 2018-09-24 DIAGNOSIS — R351 Nocturia: Secondary | ICD-10-CM | POA: Diagnosis not present

## 2018-09-24 DIAGNOSIS — R310 Gross hematuria: Secondary | ICD-10-CM

## 2018-09-24 DIAGNOSIS — R350 Frequency of micturition: Secondary | ICD-10-CM | POA: Diagnosis not present

## 2018-09-24 DIAGNOSIS — K8689 Other specified diseases of pancreas: Secondary | ICD-10-CM | POA: Diagnosis not present

## 2018-09-24 DIAGNOSIS — N1330 Unspecified hydronephrosis: Secondary | ICD-10-CM | POA: Diagnosis not present

## 2018-09-24 LAB — CBC WITH DIFFERENTIAL/PLATELET
Basophils Absolute: 0 10*3/uL (ref 0.0–0.2)
Basos: 0 %
EOS (ABSOLUTE): 0.3 10*3/uL (ref 0.0–0.4)
Eos: 4 %
Hematocrit: 42 % (ref 37.5–51.0)
Hemoglobin: 13.9 g/dL (ref 13.0–17.7)
Immature Grans (Abs): 0 10*3/uL (ref 0.0–0.1)
Immature Granulocytes: 0 %
Lymphocytes Absolute: 1.9 10*3/uL (ref 0.7–3.1)
Lymphs: 26 %
MCH: 33.5 pg — ABNORMAL HIGH (ref 26.6–33.0)
MCHC: 33.1 g/dL (ref 31.5–35.7)
MCV: 101 fL — ABNORMAL HIGH (ref 79–97)
Monocytes Absolute: 0.7 10*3/uL (ref 0.1–0.9)
Monocytes: 10 %
Neutrophils Absolute: 4.5 10*3/uL (ref 1.4–7.0)
Neutrophils: 60 %
Platelets: 204 10*3/uL (ref 150–450)
RBC: 4.15 x10E6/uL (ref 4.14–5.80)
RDW: 11.8 % (ref 11.6–15.4)
WBC: 7.4 10*3/uL (ref 3.4–10.8)

## 2018-09-24 LAB — HEPATIC FUNCTION PANEL
ALT: 15 IU/L (ref 0–44)
AST: 18 IU/L (ref 0–40)
Albumin: 4.7 g/dL — ABNORMAL HIGH (ref 3.6–4.6)
Alkaline Phosphatase: 68 IU/L (ref 39–117)
Bilirubin Total: 0.4 mg/dL (ref 0.0–1.2)
Bilirubin, Direct: 0.14 mg/dL (ref 0.00–0.40)
Total Protein: 6.7 g/dL (ref 6.0–8.5)

## 2018-09-24 LAB — BMP8+EGFR
BUN/Creatinine Ratio: 23 (ref 10–24)
BUN: 19 mg/dL (ref 8–27)
CO2: 21 mmol/L (ref 20–29)
Calcium: 9.7 mg/dL (ref 8.6–10.2)
Chloride: 101 mmol/L (ref 96–106)
Creatinine, Ser: 0.83 mg/dL (ref 0.76–1.27)
GFR calc Af Amer: 94 mL/min/{1.73_m2} (ref >59)
GFR calc non Af Amer: 81 mL/min/{1.73_m2} (ref >59)
Glucose: 89 mg/dL (ref 65–99)
Potassium: 5 mmol/L (ref 3.5–5.2)
Sodium: 141 mmol/L (ref 134–144)

## 2018-09-24 LAB — URIC ACID: Uric Acid: 4.9 mg/dL (ref 3.7–8.6)

## 2018-09-24 LAB — URINE CULTURE: Organism ID, Bacteria: NO GROWTH

## 2018-09-24 LAB — HOMOCYSTEINE: Homocysteine: 10.1 umol/L (ref 0.0–21.3)

## 2018-09-24 LAB — THYROID PANEL WITH TSH
Free Thyroxine Index: 1.6 (ref 1.2–4.9)
T3 Uptake Ratio: 29 % (ref 24–39)
T4, Total: 5.4 ug/dL (ref 4.5–12.0)
TSH: 2.65 u[IU]/mL (ref 0.450–4.500)

## 2018-09-24 MED ORDER — GADOBUTROL 1 MMOL/ML IV SOLN
6.0000 mL | Freq: Once | INTRAVENOUS | Status: AC | PRN
  Administered 2018-09-24: 12:00:00 6 mL via INTRAVENOUS

## 2018-10-22 DIAGNOSIS — M545 Low back pain: Secondary | ICD-10-CM | POA: Diagnosis not present

## 2018-10-22 DIAGNOSIS — N202 Calculus of kidney with calculus of ureter: Secondary | ICD-10-CM | POA: Diagnosis not present

## 2019-01-13 ENCOUNTER — Ambulatory Visit (INDEPENDENT_AMBULATORY_CARE_PROVIDER_SITE_OTHER): Payer: Medicare Other | Admitting: Physician Assistant

## 2019-01-13 ENCOUNTER — Other Ambulatory Visit: Payer: Self-pay

## 2019-01-13 ENCOUNTER — Encounter: Payer: Self-pay | Admitting: Physician Assistant

## 2019-01-13 VITALS — BP 138/76 | HR 54 | Temp 96.4°F | Ht 71.0 in | Wt 163.4 lb

## 2019-01-13 DIAGNOSIS — M479 Spondylosis, unspecified: Secondary | ICD-10-CM

## 2019-01-18 NOTE — Progress Notes (Signed)
BP 138/76   Pulse (!) 54   Temp (!) 96.4 F (35.8 C) (Temporal)   Ht 5\' 11"  (1.803 m)   Wt 163 lb 6.4 oz (74.1 kg)   SpO2 100%   BMI 22.79 kg/m    Subjective:    Patient ID: Joseph Eaton., male    DOB: 09-24-1932, 85 y.o.   MRN: [id number]  HPI: Joseph Tippery. is a 85 y.o. male presenting on 01/13/2019 for Hypertension (Establish Laurance Flatten patient )  The patient comes in to be established.  He is retired from being a Pharmacist, community here in the area.  He is a longtime friend of Dr. Laurance Flatten.  The patient is extremely healthy and highly active.  He does have a little bit of elevated blood pressure but tries to take lots of supplements and has overall doing very well.  He tries to follow a very strong and healthy longevity type diet.  And strives to be as healthy as possible.  He had recently had some stones and is followed by urology.  Past Medical History:  Diagnosis Date  . Bilateral carotid artery stenosis    per duplex  09-05-2012--  RICA 123456,  LICA [id number]  . Chronic kidney disease   . Heart murmur    systolic per cardiologist note dr hochrein  . History of cardiovascular stress test    2004--  per dr Darnell Level brodie note from 11-19-2006  scan showed ST changes and some questionable slight inferior attenuation with possible mild ischemia felt to be low risk with no further evaluation  . History of paroxysmal supraventricular tachycardia   . History of pneumothorax    2008--  right side spontanenous, chest tube--  resolved  . History of syncope    vasovagal response sitting of pneumothorax  . Kidney stones   . Mild aortic valve stenosis   . Right ureteral calculus    Relevant past medical, surgical, family and social history reviewed and updated as indicated. Interim medical history since our last visit reviewed. Allergies and medications reviewed and updated. DATA REVIEWED: CHART IN EPIC  Family History reviewed for pertinent findings.  Review of Systems  Constitutional:  Negative.  Negative for appetite change and fatigue.  Eyes: Negative for pain and visual disturbance.  Respiratory: Negative.  Negative for cough, chest tightness, shortness of breath and wheezing.   Cardiovascular: Negative.  Negative for chest pain, palpitations and leg swelling.  Gastrointestinal: Negative.  Negative for abdominal pain, diarrhea, nausea and vomiting.  Genitourinary: Negative.   Skin: Negative.  Negative for color change and rash.  Neurological: Negative.  Negative for weakness, numbness and headaches.  Psychiatric/Behavioral: Negative.     Allergies as of 01/13/2019      Reactions   Ibuprofen Swelling   Ginger Swelling      Medication List       Accurate as of January 13, 2019 11:59 PM. If you have any questions, ask your nurse or doctor.        Alpha-Lipoic Acid 300 MG Caps Take 1 capsule by mouth daily.   ARGININE PO Take by mouth daily.   B-12 PO Take by mouth daily.   BERBERINE COMPLEX PO Take by mouth as directed.   Beta Glucan Powd Take by mouth daily.   CHROMIUM PICOLATE PO Take by mouth as directed.   COLOSTRUM PO Take by mouth daily.   DHEA PO Take by mouth as directed.   Lutein 20 MG Caps Take 1 capsule by  mouth daily.   magnesium gluconate 500 MG tablet Commonly known as: MAGONATE Take 500 mg by mouth daily.   MEDIUM CHAIN TRIGLYCERIDES PO Take by mouth daily.   niacinamide 500 MG tablet Take 500 mg by mouth daily.   NON FORMULARY Grapefruit seed extract   NON FORMULARY Testosterone cream 1 pump daily   PROBIOTIC-10 PO Take by mouth daily.   selenium 50 MCG Tabs tablet Take 200 mcg by mouth daily.   UBIQUINOL PO Take by mouth.   UNABLE TO FIND Take by mouth daily. Eye Defense   UNABLE TO FIND Take by mouth daily. Essential Fatty Acids   UNABLE TO FIND Take by mouth daily. Imortalium   Vitamin D3 125 MCG (5000 UT) Tabs Take 5,000 Units by mouth daily.   VITAMIN K PO Take by mouth as directed.    ZINC GLUCONATE PO Take by mouth as directed.          Objective:    BP 138/76   Pulse (!) 54   Temp (!) 96.4 F (35.8 C) (Temporal)   Ht 5\' 11"  (1.803 m)   Wt 163 lb 6.4 oz (74.1 kg)   SpO2 100%   BMI 22.79 kg/m   Allergies  Allergen Reactions  . Ibuprofen Swelling  . Ginger Swelling    Wt Readings from Last 3 Encounters:  01/13/19 163 lb 6.4 oz (74.1 kg)  09/04/18 166 lb (75.3 kg)  08/13/18 162 lb (73.5 kg)    Physical Exam Constitutional:      Appearance: He is well-developed.  HENT:     Head: Normocephalic and atraumatic.  Eyes:     Conjunctiva/sclera: Conjunctivae normal.     Pupils: Pupils are equal, round, and reactive to light.  Neck:     Musculoskeletal: Normal range of motion and neck supple.  Cardiovascular:     Rate and Rhythm: Normal rate and regular rhythm.     Heart sounds: Normal heart sounds.  Pulmonary:     Effort: Pulmonary effort is normal.     Breath sounds: Normal breath sounds.  Abdominal:     General: Bowel sounds are normal.     Palpations: Abdomen is soft.  Musculoskeletal: Normal range of motion.  Skin:    General: Skin is warm and dry.     Results for orders placed or performed during the hospital encounter of 09/23/18  I-STAT creatinine  Result Value Ref Range   Creatinine, Ser 0.90 0.61 - 1.24 mg/dL      Assessment & Plan:   1. Osteoarthritis of spine, unspecified spinal osteoarthritis complication status, unspecified spinal region ........................   Continue all other maintenance medications as listed above.  Follow up plan: Return in about 3 months (around 04/15/2019).  Educational handout given for [place] PA-C [place] [address]  [place], [place] [id number] [phone]   01/18/2019, 10:48 PM

## 2019-01-20 ENCOUNTER — Ambulatory Visit: Payer: Medicare Other | Admitting: Physician Assistant

## 2019-03-03 ENCOUNTER — Ambulatory Visit (HOSPITAL_COMMUNITY): Payer: Medicare Other | Attending: Cardiology

## 2019-03-03 ENCOUNTER — Other Ambulatory Visit: Payer: Self-pay

## 2019-03-03 DIAGNOSIS — R002 Palpitations: Secondary | ICD-10-CM | POA: Insufficient documentation

## 2019-03-03 DIAGNOSIS — I519 Heart disease, unspecified: Secondary | ICD-10-CM

## 2019-03-03 DIAGNOSIS — I083 Combined rheumatic disorders of mitral, aortic and tricuspid valves: Secondary | ICD-10-CM | POA: Insufficient documentation

## 2019-03-03 DIAGNOSIS — I119 Hypertensive heart disease without heart failure: Secondary | ICD-10-CM | POA: Diagnosis not present

## 2019-03-05 ENCOUNTER — Encounter: Payer: Self-pay | Admitting: Cardiology

## 2019-03-05 ENCOUNTER — Ambulatory Visit (HOSPITAL_COMMUNITY)
Admission: RE | Admit: 2019-03-05 | Discharge: 2019-03-05 | Disposition: A | Discharge status: Home / Self Care | Payer: Medicare Other | Source: Ambulatory Visit | Attending: Cardiology | Admitting: Cardiology

## 2019-03-05 ENCOUNTER — Ambulatory Visit (INDEPENDENT_AMBULATORY_CARE_PROVIDER_SITE_OTHER): Payer: Medicare Other | Admitting: Cardiology

## 2019-03-05 ENCOUNTER — Other Ambulatory Visit: Payer: Self-pay

## 2019-03-05 VITALS — BP 127/66 | HR 62 | Temp 97.2°F | Ht 71.0 in | Wt 167.8 lb

## 2019-03-05 DIAGNOSIS — I350 Nonrheumatic aortic (valve) stenosis: Secondary | ICD-10-CM

## 2019-03-05 DIAGNOSIS — I10 Essential (primary) hypertension: Secondary | ICD-10-CM

## 2019-03-05 DIAGNOSIS — I519 Heart disease, unspecified: Secondary | ICD-10-CM

## 2019-03-05 DIAGNOSIS — I6529 Occlusion and stenosis of unspecified carotid artery: Secondary | ICD-10-CM | POA: Diagnosis not present

## 2019-03-05 DIAGNOSIS — I824Z3 Acute embolism and thrombosis of unspecified deep veins of distal lower extremity, bilateral: Secondary | ICD-10-CM | POA: Diagnosis not present

## 2019-03-05 DIAGNOSIS — Z7189 Other specified counseling: Secondary | ICD-10-CM

## 2019-03-05 DIAGNOSIS — R000 Tachycardia, unspecified: Secondary | ICD-10-CM

## 2019-03-05 NOTE — Progress Notes (Signed)
Cardiology Office Note   Date:  03/05/2019   ID:  Joseph Eaton., DOB June 09, 1932, MRN [id number]  PCP:  Joseph Sleeper, PA-C  Cardiologist:   No primary care provider on file.   Chief Complaint  Patient presents with  . Tachycardia      History of Present Illness: Joseph Potts. is a 85 y.o. male who presents for follow up of a murmur. I last saw him in 2013. He had syncope without a clear etiology. He had mild AS on echo and moderate carotid stenosis.  He had tachcycardia and wondered if he had POTS.   However, I did not suspect this.  He got an Alive Cor.  Of interest I did do his echocardiogram which demonstrated well-preserved left ventricular function with a normal EF.  However, the right ventricle systolic pressure was normal but with severely reduced systolic function.  This was new compared with a previous echo in March of last year.  His aortic valve which had some mild sclerosis and stiffness was unchanged.  I did order a CXR which was unremarkable.    He presents today for follow up.  He had an echo today and his right ventricular function is now normal.  It had been severely reduced before.  He has well-preserved left ventricular function.  His right atrial and right ventricular size were normal.  There was mild tricuspid valve regurgitation.  He does have severe aortic stenosis.  His mean gradient was 34.  He was tachycardic previously.  However, he has had no rapid heart rates.  He has been feeling fine.  He denies any presyncope or syncope.  Said no shortness of breath, PND or orthopnea.  Previous weight loss has stabilized.   Past Medical History:  Diagnosis Date  . Bilateral carotid artery stenosis    per duplex  09-05-2012--  RICA 123456,  LICA [id number]  . Chronic kidney disease   . Heart murmur    systolic per cardiologist note dr Joseph Potts  . History of cardiovascular stress test    2004--  per dr Joseph Potts note from 11-19-2006  scan showed ST  changes and some questionable slight inferior attenuation with possible mild ischemia felt to be low risk with no further evaluation  . History of paroxysmal supraventricular tachycardia   . History of pneumothorax    2008--  right side spontanenous, chest tube--  resolved  . History of syncope    vasovagal response sitting of pneumothorax  . Kidney stones   . Mild aortic valve stenosis   . Right ureteral calculus     Past Surgical History:  Procedure Laterality Date  . ANAL FISSURE REPAIR  1985  . ANTERIOR CERVICAL DECOMP/DISCECTOMY FUSION N/A 02/16/2014   Procedure: CERVICAL THREE TO FOUR, CERVICAL FOUR TO FIVE, CERVICAL FIVE TO SIX  ANTERIOR CERVICAL DECOMPRESSION/DISKECTOMY/FUSION;  Surgeon: Floyce Stakes, MD;  Location: [facility];  Service: Neurosurgery;  Laterality: N/A;  C3-4 C4-5 C5-6 ANTERIOR CERVICAL DECOMPRESSION/DISKECTOMY/FUSION  . COLONOSCOPY    . FULLTHICKNESS SKIN LESION RESECTION RIGHT LONG/ RING WEB SPACE  03-15-2006  . KNEE ARTHROSCOPY W/ MENISCECTOMY Left 05-18-2010  . LUMBAR DISC SURGERY  1973   L4 -- L5  . TENDON REPAIR  yrs ago   hand laceration  . TRANSTHORACIC ECHOCARDIOGRAM  09-06-2011   mild LVH, grade 1 diastolic dysfunction, ef [id number]  mild AV stenosis and mild AR/  trivial MR ,PR and TR     Current Outpatient Medications  Medication Sig Dispense Refill  . Alpha-Lipoic Acid 300 MG CAPS Take 1 capsule by mouth daily.    Jolyne Loa Grape-Goldenseal (BERBERINE COMPLEX PO) Take by mouth as directed.    . Beta Glucan POWD Take by mouth daily.    . Cholecalciferol (VITAMIN D3) 5000 UNITS TABS Take 5,000 Units by mouth daily.    . Chromium Picolinate (CHROMIUM PICOLATE PO) Take by mouth as directed.    . Cyanocobalamin (B-12 PO) Take by mouth daily.    . Lutein 20 MG CAPS Take 1 capsule by mouth daily.    . magnesium gluconate (MAGONATE) 500 MG tablet Take 500 mg by mouth daily.    . niacinamide 500 MG tablet Take 500 mg by mouth daily.    .  NON FORMULARY Grapefruit seed extract    . NON FORMULARY Testosterone cream 1 pump daily    . Nutritional Supplements (DHEA PO) Take by mouth as directed.    . Probiotic Product (PROBIOTIC-10 PO) Take by mouth daily.    Marland Kitchen selenium 50 MCG TABS Take 200 mcg by mouth daily.    Marland Kitchen UBIQUINOL PO Take by mouth.    Marland Kitchen UNABLE TO FIND Take by mouth daily. Eye Defense    . UNABLE TO FIND Take by mouth daily. Essential Fatty Acids    . UNABLE TO FIND Take by mouth daily. Imortalium    . VITAMIN K PO Take by mouth as directed.    Marland Kitchen ZINC GLUCONATE PO Take by mouth as directed.     No current facility-administered medications for this visit.    Allergies:   Ibuprofen and Ginger    ROS:  Please see the history of present illness.   Otherwise, review of systems are positive for none.   All other systems are reviewed and negative.    PHYSICAL EXAM: VS:  BP 127/66   Pulse 62   Temp (!) 97.2 F (36.2 C)   Ht 5\' 11"  (1.803 m)   Wt 167 lb 12.8 oz (76.1 kg)   SpO2 97%   BMI 23.40 kg/m  , BMI Body mass index is 23.4 kg/m. GENERAL:  Well appearing NECK:  No jugular venous distention, waveform within normal limits, carotid upstroke brisk and symmetric, no bruits, no thyromegaly.  Transmitted systolic murmurs LUNGS:  Clear to auscultation bilaterally CHEST:  Unremarkable HEART:  PMI not displaced or sustained,S1 and S2 within normal limits, no S3, no S4, no clicks, no rubs, 3 out of 6 apical mid peaking systolic murmur radiating slightly up aortic outflow tract, no diastolic murmurs ABD:  Flat, positive bowel sounds normal in frequency in pitch, no bruits, no rebound, no guarding, no midline pulsatile mass, no hepatomegaly, no splenomegaly EXT:  2 plus pulses throughout, no edema, no cyanosis no clubbing   EKG:  EKG is not ordered today.    Recent Labs: 09/23/2018: ALT 15; BUN 19; Creatinine, Ser 0.90; Hemoglobin 13.9; Platelets 204; Potassium 5.0; Sodium 141; TSH 2.650    Lipid Panel     Component Value Date/Time   CHOL 139 01/28/2017 1159   TRIG 54 01/28/2017 1159   HDL 44 01/28/2017 1159   CHOLHDL 3.2 01/28/2017 1159   LDLCALC 84 01/28/2017 1159      Wt Readings from Last 3 Encounters:  03/05/19 167 lb 12.8 oz (76.1 kg)  01/13/19 163 lb 6.4 oz (74.1 kg)  09/04/18 166 lb (75.3 kg)      Other studies Reviewed: Additional studies/ records that were reviewed today include:  Echo.  Review of the above records demonstrates:  See above.    ASSESSMENT AND PLAN:  TACHYCARDIA:This has resolved.  No change in therapy.  RV DYSFUNCTION:   His RV dysfunction is improved.  There was no other suggestion of pulmonary embolism but as this is in the differential I would like to do venous Dopplers to make sure he does not have any evidence of thrombus.  If not there would be no indication for further evaluation anticoagulation as he is improved.   CAROTID STENOSIS:This was mild.  We will follow this up in a couple of years.  AS:  This was moderately severe but he has no symptoms.  I will follow this up symptomatically with repeat echoes.  HTN: Blood pressure is at target.  No change in therapy.  WEIGHT LOSS:   His weight loss is stabilized.  Is been no etiology for this loss.  He has had evaluation to include CT and MRI of his abdomen which was unrevealing.    Current medicines are reviewed at length with the patient today.  The patient does not have concerns regarding medicines.  The following changes have been made:  None  Labs/ tests ordered today include:    Orders Placed This Encounter  Procedures  . VAS [place] LOWER EXTREMITY VENOUS (DVT)     Disposition:   FU with me in six months.    Signed, Minus Breeding, MD  03/05/2019 1:58 PM    [facility]

## 2019-03-05 NOTE — Patient Instructions (Signed)
Medication Instructions:  Your physician recommends that you continue on your current medications as directed. Please refer to the Current Medication list given to you today.  *If you need a refill on your cardiac medications before your next appointment, please call your pharmacy*  Lab Work: none If you have labs (blood work) drawn today and your tests are completely normal, you will receive your results only by: Marland Kitchen MyChart Message (if you have MyChart) OR . A paper copy in the mail If you have any lab test that is abnormal or we need to change your treatment, we will call you to review the results.  Testing/Procedures: Your physician has requested that you have a lower or upper extremity venous duplex. This test is an ultrasound of the veins in the legs or arms. It looks at venous blood flow that carries blood from the heart to the legs or arms. Allow one hour for a Lower Venous exam. Allow thirty minutes for an Upper Venous exam. There are no restrictions or special instructions.   TODAY   Follow-Up: At [facility], you and your health needs are our priority.  As part of our continuing mission to provide you with exceptional heart care, we have created designated Provider Care Teams.  These Care Teams include your primary Cardiologist (physician) and Advanced Practice Providers (APPs -  Physician Assistants and Nurse Practitioners) who all work together to provide you with the care you need, when you need it.  Your next appointment:   6 month(s)  The format for your next appointment:   In Person  Provider:   Minus Breeding, MD

## 2019-06-03 ENCOUNTER — Telehealth: Payer: Self-pay | Admitting: Physician Assistant

## 2019-06-03 NOTE — Chronic Care Management (AMB) (Signed)
  Chronic Care Management   Outreach Note  06/03/2019 Name: Oronde Kempel. MRN: [id number] DOB: 1933-03-13  [name]. is a 86 y.o. year old male who is a primary care patient of Terald Sleeper, PA-C. I reached out to [name]. by phone today in response to a referral sent by Mr. BRAEDON CHAMPION Jr.'s health plan.     An unsuccessful telephone outreach was attempted today. The patient was referred to the case management team for assistance with care management and care coordination.   Follow Up Plan: A HIPPA compliant phone message was left for the patient providing contact information and requesting a return call. The care management team will reach out to the patient again over the next 7 days.  If patient returns call to provider office, please advise to call [place] at [phone].  [place], [place] [address] Direct Dial: [phone] [email or web address]: [email or web address]

## 2019-06-05 NOTE — Chronic Care Management (AMB) (Signed)
  Chronic Care Management   Outreach Note  06/05/2019 Name: Joseph Potts. MRN: [id number] DOB: 1933/03/17  Joseph Potts. is a 86 y.o. year old male who is a primary care patient of Terald Sleeper, PA-C. I reached out to Joseph Potts. by phone today in response to a referral sent by Mr. GEOVANNIE MACNAB Jr.'s health plan.     A second unsuccessful telephone outreach was attempted today. The patient was referred to the case management team for assistance with care management and care coordination.   Follow Up Plan: A HIPPA compliant phone message was left for the patient providing contact information and requesting a return call. The care management team will reach out to the patient again over the next 7 days. If patient returns call to provider office, please advise to call [place] at [phone].  [place], [place] [address] Direct Dial: [phone] [email or web address]

## 2019-06-10 NOTE — Chronic Care Management (AMB) (Signed)
  Chronic Care Management   Outreach Note  06/10/2019 Name: Joseph Potts. MRN: [id number] DOB: 1932/06/21  Joseph Potts. is a 86 y.o. year old male who is a primary care patient of Terald Sleeper, PA-C. I reached out to Joseph Potts. by phone today in response to a referral sent by [name]'s health plan.     Third unsuccessful telephone outreach was attempted today. The patient was referred to the case management team for assistance with care management and care coordination. The patient's primary care provider has been notified of our unsuccessful attempts to make or maintain contact with the patient. The care management team is pleased to engage with this patient at any time in the future should he/she be interested in assistance from the care management team.   Follow Up Plan: A HIPPA compliant phone message was left for the patient providing contact information and requesting a return call.  The care management team is available to follow up with the patient after provider conversation with the patient regarding recommendation for care management engagement and subsequent re-referral to the care management team.  If patient returns call to provider office, please advise to call [facility] at [phone].  [place], [place] [address] Direct Dial: [phone] [email or web address]: [email or web address]

## 2019-12-31 DIAGNOSIS — N200 Calculus of kidney: Secondary | ICD-10-CM | POA: Diagnosis not present

## 2020-01-23 IMAGING — MR MRI ABDOMEN WITH AND WITHOUT CONTRAST
10 of 18 series · 23 of 48 positions shown · IV contrast (6ML gadavist)
Comparison: CT 09/23/2018

CLINICAL DATA: Evaluate liver lesion.  Abnormal CT.

EXAM:
MRI ABDOMEN WITHOUT AND WITH CONTRAST
TECHNIQUE: Multiplanar multisequence MR imaging of the abdomen was performed
both before and after the administration of intravenous contrast.
CONTRAST:  6 cc Gadavist.

[Series 3: T2 · coronal · 5.0mm · 1.14mm/px · 1 of 36 slices shown (1 of 3)]
[im 1/36]
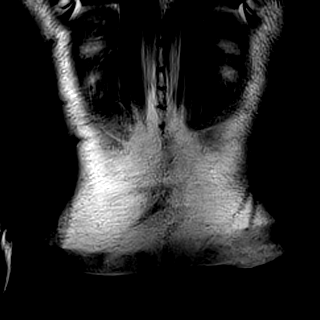

[Series 4: ax dual echo_in · axial · 4.0mm · 0.59mm/px · z∈[-177,+75]mm · 2 of 64 slices shown]
[im 1/64]
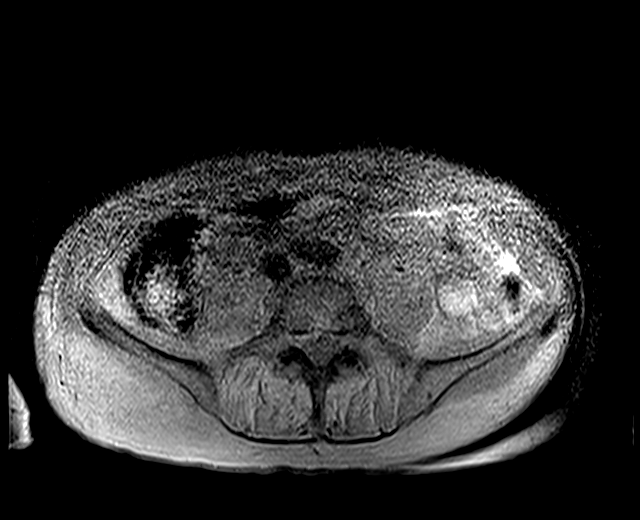
[im 64/64]
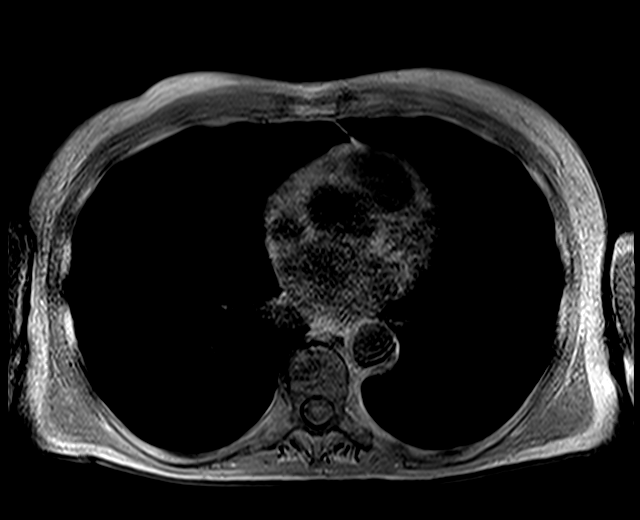

[Series 5: ax dual echo_opp · axial · 4.0mm · 0.59mm/px · z∈[-177,+75]mm · 2 of 64 slices shown]
[im 1/64]
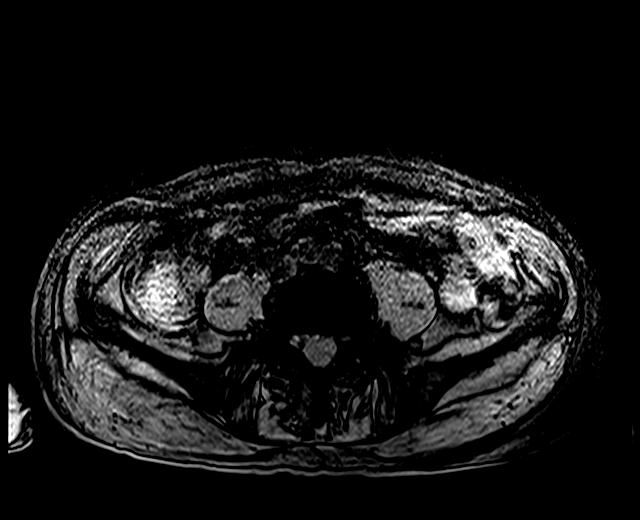
[im 64/64]
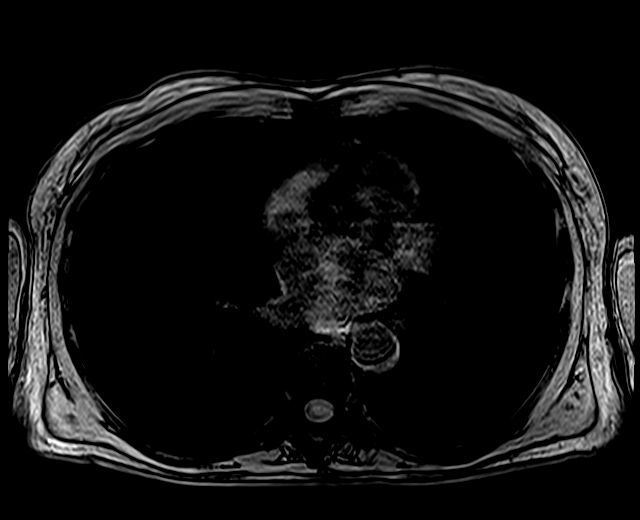

[Series 8: T2 · axial · 4.0mm · 1.07mm/px · z∈[-140,+85]mm · 2 of 48 slices shown (2 of 3)]
[im 1/48]
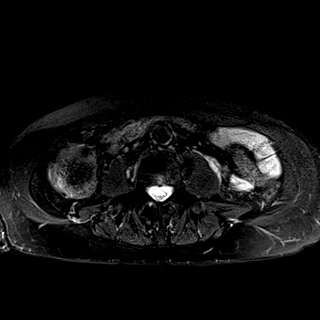
[im 48/48]
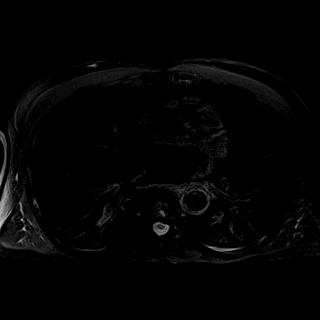

[Series 9: DWI · axial · 5.0mm · 0.95mm/px · z∈[-144,+90]mm · 3 of 80 slices shown]
[im 1/80]
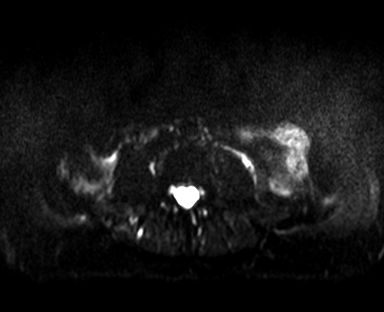
[im 40/80]
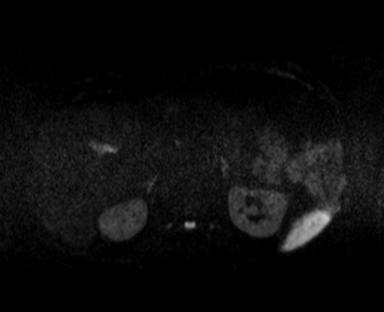
[im 80/80]
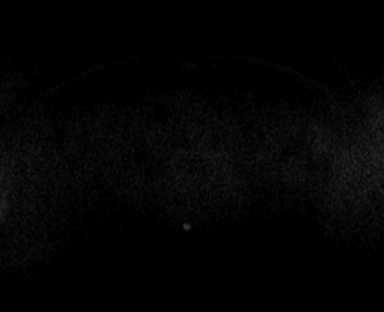

[Series 10: ax dwi_adc · axial · 5.0mm · 0.95mm/px · z∈[-144,+90]mm · 2 of 40 slices shown]
[im 1/40]
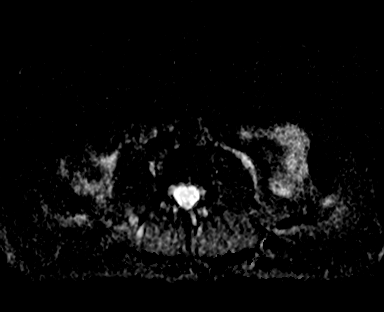
[im 40/40]
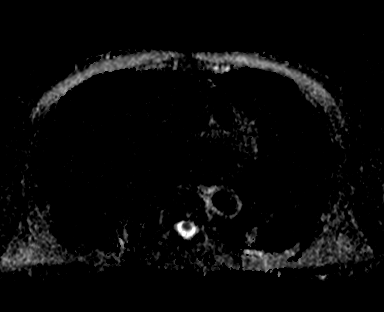

[Series 11: bSSFP · axial · 4.0mm · 0.68mm/px · z∈[-165,+111]mm · 3 of 70 slices shown]
[im 1/70]
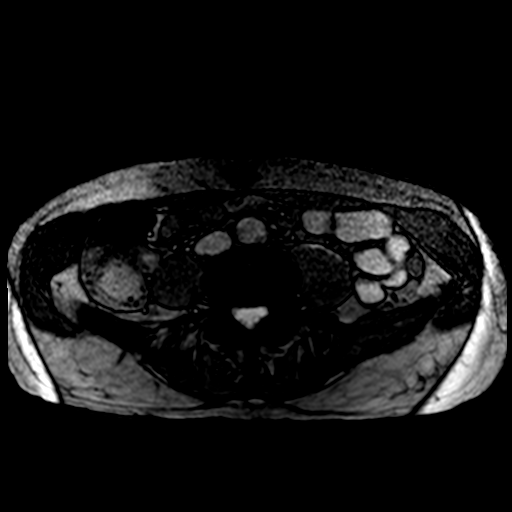
[im 35/70]
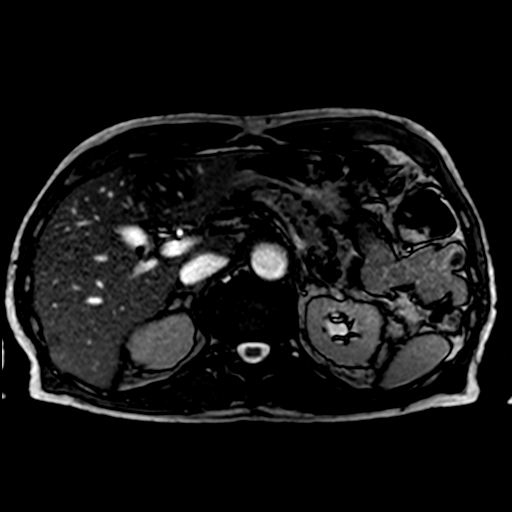
[im 70/70]
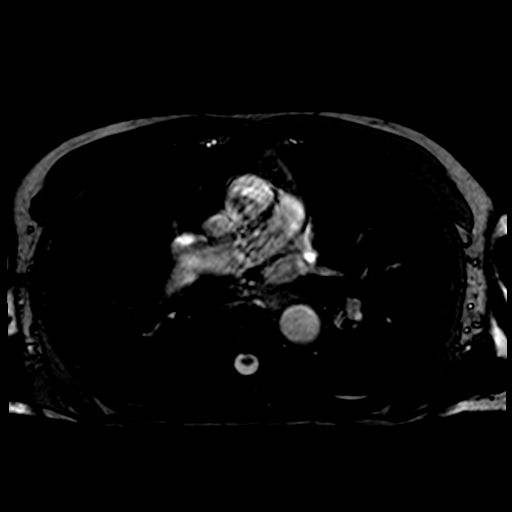

[Series 29: T2 · axial · 5.0mm · 1.39mm/px · z∈[-168,+66]mm · 2 of 40 slices shown (3 of 3)]
[im 1/40]
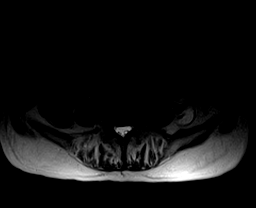
[im 40/40]
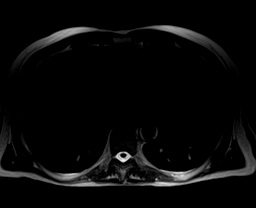

[Series 5004: 23 sec sub · axial · 3.5mm · 0.57mm/px · z∈[-175,+73]mm · 3 of 72 slices shown]
[im 1/72]
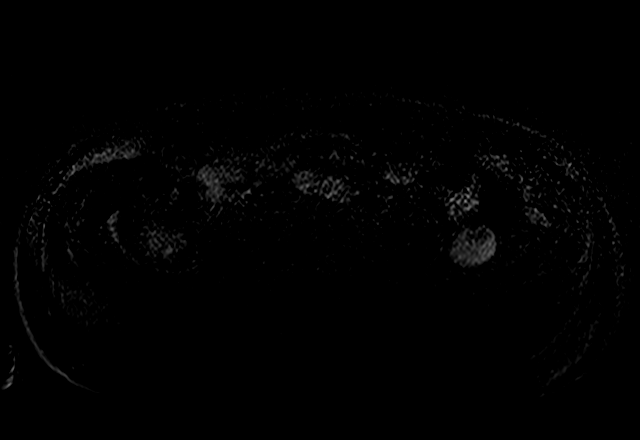
[im 36/72]
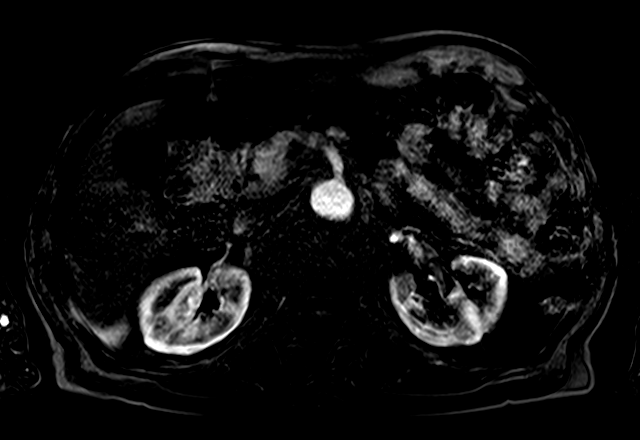
[im 72/72]
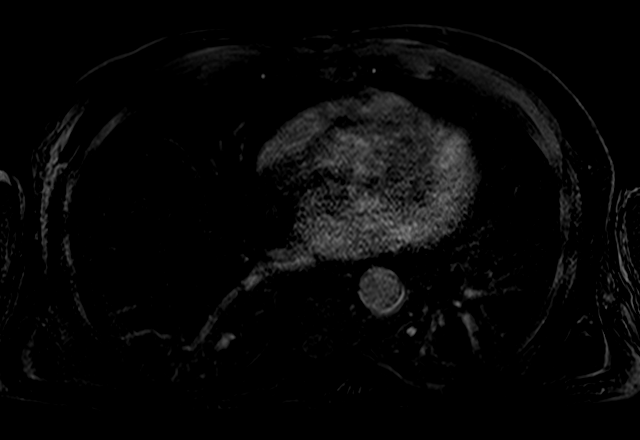

[Series 5005: 45 sec sub · axial · 3.5mm · 0.57mm/px · z∈[-175,+73]mm · 3 of 72 slices shown]
[im 1/72]
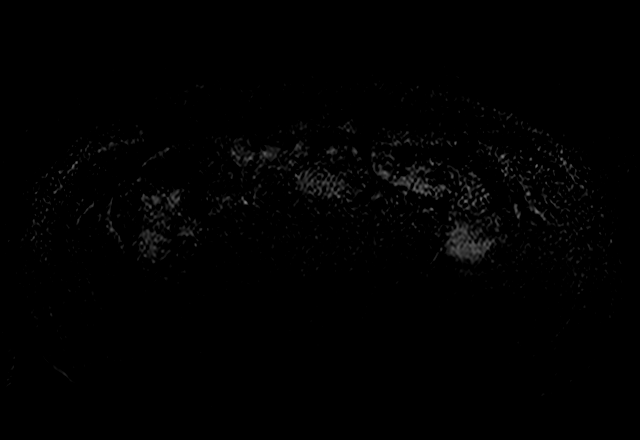
[im 36/72]
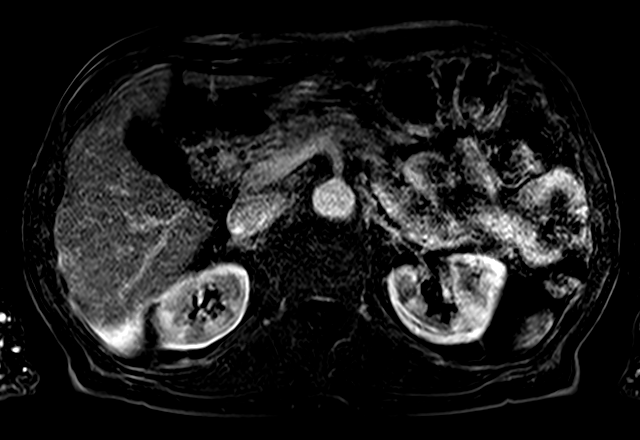
[im 72/72]
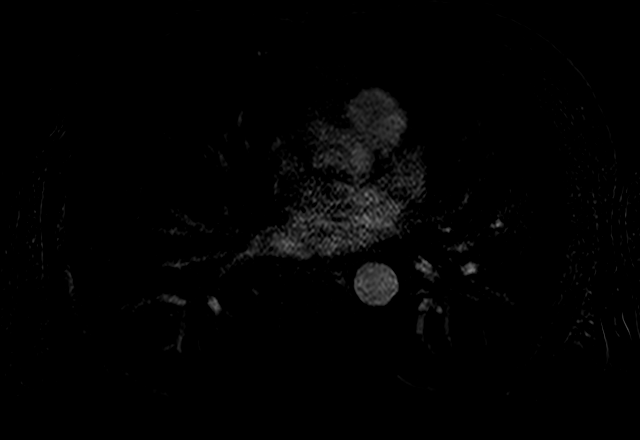

[23 of 48 positions shown; findings below may reference images not displayed]

FINDINGS: Lower chest: No acute findings.

Hepatobiliary: No suspicious enhancing liver lesions identified.
Anterior dome of liver cyst measures 6 mm. On the diffusion-weighted
images no abnormal areas of restricted diffusion identified. The
focal area of low attenuation within segment 5 is favored to
represent focal fatty deposition.

Small stones layering within the gallbladder are noted measuring up
to 4 mm. No biliary ductal dilatation.

Pancreas: No main duct dilatation or inflammation. Several small
cystic lesions are noted within the pancreas. This includes a 5 mm
cystic lesion within neck, image 20/29. Within the uncinate process
there is a 7 mm cystic lesion, image 30/28.

Spleen:  Within normal limits in size and appearance.

Adrenals/Urinary Tract: Normal appearance of the adrenal glands.
Mild left hydronephrosis and hydroureter identified. Cannot confirm
persistence left ureteral calculus.

Stomach/Bowel: Visualized portions within the abdomen are
unremarkable.

Vascular/Lymphatic: No pathologically enlarged lymph nodes
identified. No abdominal aortic aneurysm demonstrated.

Other:  None

Musculoskeletal: No suspicious bone lesions identified.
IMPRESSION: 1. No suspicious liver lesion identified. The vague area of low
attenuation within segment 5 does not have a corresponding MR
abnormality and is favored to represent an area of focal fatty
deposition.
2. Gallstones
3. Two small cystic lesions identified within the pancreas. Followup
imaging in 24 months is recommended with pancreas protocol MRI. No
inflammation or mass identified.
4. Mild left hydronephrosis and hydroureter. Cannot confirm absence
of previously noted left ureteral calculus on current exam.

## 2020-03-10 DIAGNOSIS — L821 Other seborrheic keratosis: Secondary | ICD-10-CM | POA: Diagnosis not present

## 2020-03-10 DIAGNOSIS — L814 Other melanin hyperpigmentation: Secondary | ICD-10-CM | POA: Diagnosis not present

## 2020-03-10 DIAGNOSIS — D225 Melanocytic nevi of trunk: Secondary | ICD-10-CM | POA: Diagnosis not present

## 2020-03-10 DIAGNOSIS — D2371 Other benign neoplasm of skin of right lower limb, including hip: Secondary | ICD-10-CM | POA: Diagnosis not present

## 2020-03-10 DIAGNOSIS — D485 Neoplasm of uncertain behavior of skin: Secondary | ICD-10-CM | POA: Diagnosis not present

## 2020-03-10 DIAGNOSIS — L570 Actinic keratosis: Secondary | ICD-10-CM | POA: Diagnosis not present

## 2020-04-13 ENCOUNTER — Other Ambulatory Visit: Payer: Medicare Other

## 2020-04-13 ENCOUNTER — Other Ambulatory Visit: Payer: Self-pay

## 2020-06-15 ENCOUNTER — Ambulatory Visit (INDEPENDENT_AMBULATORY_CARE_PROVIDER_SITE_OTHER): Payer: Medicare Other

## 2020-06-15 ENCOUNTER — Ambulatory Visit (INDEPENDENT_AMBULATORY_CARE_PROVIDER_SITE_OTHER): Payer: Medicare Other | Admitting: Nurse Practitioner

## 2020-06-15 ENCOUNTER — Other Ambulatory Visit: Payer: Self-pay

## 2020-06-15 ENCOUNTER — Encounter: Payer: Self-pay | Admitting: Nurse Practitioner

## 2020-06-15 VITALS — BP 122/64 | HR 70 | Temp 97.8°F | Ht 71.0 in | Wt 172.2 lb

## 2020-06-15 DIAGNOSIS — M25561 Pain in right knee: Secondary | ICD-10-CM | POA: Diagnosis not present

## 2020-06-15 DIAGNOSIS — M222X1 Patellofemoral disorders, right knee: Secondary | ICD-10-CM | POA: Diagnosis not present

## 2020-06-15 NOTE — Progress Notes (Signed)
Acute Office Visit  Subjective:    Patient ID: Joseph Potts., male    DOB: 1932-08-09, 87 y.o.   MRN: [id number]  Chief Complaint  Patient presents with  . knot on right leg    Patient states that he has a knot on his right leg that has been there x 1 week.     HPI Patient is an 87 year old male who presents to clinic with a knot on his right lower knee.  Patient reports symptoms present in the last 1 week.  Patient is unable to tell if he bumps his leg but reports he woke up one morning and found the swelling.  It is actually decreased in size no bruising, redness or pain.  Not is palpable and hard to touch.  Patient denies any numbness and tingling on affected extremity.  Past Medical History:  Diagnosis Date  . Bilateral carotid artery stenosis    per duplex  09-05-2012--  RICA 2-72%,  LICA 53-66%  . Chronic kidney disease   . Heart murmur    systolic per cardiologist note dr hochrein  . History of cardiovascular stress test    2004--  per dr Darnell Level brodie note from 11-19-2006  scan showed ST changes and some questionable slight inferior attenuation with possible mild ischemia felt to be low risk with no further evaluation  . History of paroxysmal supraventricular tachycardia   . History of pneumothorax    2008--  right side spontanenous, chest tube--  resolved  . History of syncope    vasovagal response sitting of pneumothorax  . Kidney stones   . Mild aortic valve stenosis   . Right ureteral calculus     Past Surgical History:  Procedure Laterality Date  . ANAL FISSURE REPAIR  1985  . ANTERIOR CERVICAL DECOMP/DISCECTOMY FUSION N/A 02/16/2014   Procedure: CERVICAL THREE TO FOUR, CERVICAL FOUR TO FIVE, CERVICAL FIVE TO SIX  ANTERIOR CERVICAL DECOMPRESSION/DISKECTOMY/FUSION;  Surgeon: Floyce Stakes, MD;  Location: [facility];  Service: Neurosurgery;  Laterality: N/A;  C3-4 C4-5 C5-6 ANTERIOR CERVICAL DECOMPRESSION/DISKECTOMY/FUSION  . COLONOSCOPY    .  FULLTHICKNESS SKIN LESION RESECTION RIGHT LONG/ RING WEB SPACE  03-15-2006  . KNEE ARTHROSCOPY W/ MENISCECTOMY Left 05-18-2010  . LUMBAR DISC SURGERY  1973   L4 -- L5  . TENDON REPAIR  yrs ago   hand laceration  . TRANSTHORACIC ECHOCARDIOGRAM  09-06-2011   mild LVH, grade 1 diastolic dysfunction, ef 44-03%/  mild AV stenosis and mild AR/  trivial MR ,PR and TR    Family History  Problem Relation Age of Onset  . Arrhythmia Mother        Stroke  . Arrhythmia Father        CNS bleed    Social History   Socioeconomic History  . Marital status: Married    Spouse name: Not on file  . Number of children: 5  . Years of education: Not on file  . Highest education level: Not on file  Occupational History  . Occupation: Pharmacist, community  Tobacco Use  . Smoking status: Never Smoker  . Smokeless tobacco: Never Used  Vaping Use  . Vaping Use: Never used  Substance and Sexual Activity  . Alcohol use: No  . Drug use: No  . Sexual activity: Not on file  Other Topics Concern  . Not on file  Social History Narrative  . Not on file   Social Determinants of Health   Financial Resource Strain: Not on  file  Food Insecurity: Not on file  Transportation Needs: Not on file  Physical Activity: Not on file  Stress: Not on file  Social Connections: Not on file  Intimate Partner Violence: Not on file    Outpatient Medications Prior to Visit  Medication Sig Dispense Refill  . Alpha-Lipoic Acid 300 MG CAPS Take 1 capsule by mouth daily.    Jolyne Loa Grape-Goldenseal (BERBERINE COMPLEX PO) Take by mouth as directed.    . Beta Glucan POWD Take by mouth daily.    . Cholecalciferol (VITAMIN D3) 5000 UNITS TABS Take 5,000 Units by mouth daily.    . Chromium Picolinate (CHROMIUM PICOLATE PO) Take by mouth as directed.    . Cyanocobalamin (B-12 PO) Take by mouth daily.    . Lutein 20 MG CAPS Take 1 capsule by mouth daily.    . magnesium gluconate (MAGONATE) 500 MG tablet Take 500 mg by mouth  daily.    . niacinamide 500 MG tablet Take 500 mg by mouth daily.    . NON FORMULARY Grapefruit seed extract    . NON FORMULARY Testosterone cream 1 pump daily    . Nutritional Supplements (DHEA PO) Take by mouth as directed.    . Probiotic Product (PROBIOTIC-10 PO) Take by mouth daily.    Marland Kitchen selenium 50 MCG TABS Take 200 mcg by mouth daily.    Marland Kitchen UBIQUINOL PO Take by mouth.    Marland Kitchen UNABLE TO FIND Take by mouth daily. Eye Defense    . UNABLE TO FIND Take by mouth daily. Essential Fatty Acids    . UNABLE TO FIND Take by mouth daily. Imortalium    . VITAMIN K PO Take by mouth as directed.    Marland Kitchen ZINC GLUCONATE PO Take by mouth as directed.     No facility-administered medications prior to visit.    Allergies  Allergen Reactions  . Ibuprofen Swelling  . Ginger Swelling    Review of Systems  Constitutional: Negative.   HENT: Negative.   Eyes: Negative.   Respiratory: Negative.   Cardiovascular: Negative.   Gastrointestinal: Negative.   Musculoskeletal: Positive for joint swelling.  Neurological: Negative.   All other systems reviewed and are negative.      Objective:    Physical Exam Vitals reviewed.  Constitutional:      Appearance: Normal appearance.  HENT:     Head: Normocephalic.     Nose: Nose normal.  Eyes:     Conjunctiva/sclera: Conjunctivae normal.  Cardiovascular:     Rate and Rhythm: Normal rate and regular rhythm.     Pulses: Normal pulses.  Pulmonary:     Effort: Pulmonary effort is normal.     Breath sounds: Normal breath sounds.  Abdominal:     General: Bowel sounds are normal.  Musculoskeletal:        General: Tenderness present.  Skin:    General: Skin is warm.  Neurological:     Mental Status: He is alert and oriented to person, place, and time.     BP 122/64   Pulse 70   Temp 97.8 F (36.6 C) (Temporal)   Ht 5\' 11"  (1.803 m)   Wt 172 lb 3.2 oz (78.1 kg)   SpO2 97%   BMI 24.02 kg/m  Wt Readings from Last 3 Encounters:  06/15/20 172 lb  3.2 oz (78.1 kg)  03/05/19 167 lb 12.8 oz (76.1 kg)  01/13/19 163 lb 6.4 oz (74.1 kg)    Health Maintenance Due  Topic Date  Due  . COVID-19 Vaccine (1) Never done  . TETANUS/TDAP  Never done  . PNA vac Low Risk Adult (1 of 2 - PCV13) Never done    There are no preventive care reminders to display for this patient.   Lab Results  Component Value Date   TSH 2.650 09/23/2018   Lab Results  Component Value Date   WBC 7.4 09/23/2018   HGB 13.9 09/23/2018   HCT 42.0 09/23/2018   MCV 101 (H) 09/23/2018   PLT 204 09/23/2018   Lab Results  Component Value Date   NA 141 09/23/2018   K 5.0 09/23/2018   CO2 21 09/23/2018   GLUCOSE 89 09/23/2018   BUN 19 09/23/2018   CREATININE 0.90 09/23/2018   BILITOT 0.4 09/23/2018   ALKPHOS 68 09/23/2018   AST 18 09/23/2018   ALT 15 09/23/2018   PROT 6.7 09/23/2018   ALBUMIN 4.7 (H) 09/23/2018   CALCIUM 9.7 09/23/2018   ANIONGAP 10 02/16/2014   Lab Results  Component Value Date   CHOL 139 01/28/2017   Lab Results  Component Value Date   HDL 44 01/28/2017   Lab Results  Component Value Date   LDLCALC 84 01/28/2017   Lab Results  Component Value Date   TRIG 54 01/28/2017   Lab Results  Component Value Date   CHOLHDL 3.2 01/28/2017   No results found for: HGBA1C     Assessment & Plan:   Problem List Items Addressed This Visit      Other   Acute pain of right knee - Primary    New knot on patient's right lower knee.  Patient denies pain, irritation, numbness or tingling.  Completed x-ray of the knee results pending.  Advised patient to use anti-inflammatory if any pain, ice and follow-up with worsening unresolved symptoms.  Patient verbalized understanding.      Relevant Orders   DG Knee 1-2 Views Right       No orders of the defined types were placed in this encounter.    Ivy Lynn, NP

## 2020-06-15 NOTE — Assessment & Plan Note (Signed)
New knot on patient's right lower knee.  Patient denies pain, irritation, numbness or tingling.  Completed x-ray of the knee results pending.  Advised patient to use anti-inflammatory if any pain, ice and follow-up with worsening unresolved symptoms.  Patient verbalized understanding.

## 2020-08-01 ENCOUNTER — Other Ambulatory Visit: Payer: Self-pay

## 2020-08-01 ENCOUNTER — Other Ambulatory Visit: Payer: Medicare Other

## 2020-09-22 DIAGNOSIS — H02834 Dermatochalasis of left upper eyelid: Secondary | ICD-10-CM | POA: Diagnosis not present

## 2020-09-22 DIAGNOSIS — Z961 Presence of intraocular lens: Secondary | ICD-10-CM | POA: Diagnosis not present

## 2020-09-22 DIAGNOSIS — H25811 Combined forms of age-related cataract, right eye: Secondary | ICD-10-CM | POA: Diagnosis not present

## 2020-09-22 DIAGNOSIS — H02831 Dermatochalasis of right upper eyelid: Secondary | ICD-10-CM | POA: Diagnosis not present

## 2020-09-22 DIAGNOSIS — H04123 Dry eye syndrome of bilateral lacrimal glands: Secondary | ICD-10-CM | POA: Diagnosis not present

## 2020-10-03 DIAGNOSIS — Z20822 Contact with and (suspected) exposure to covid-19: Secondary | ICD-10-CM | POA: Diagnosis not present

## 2021-02-02 DIAGNOSIS — Z20822 Contact with and (suspected) exposure to covid-19: Secondary | ICD-10-CM | POA: Diagnosis not present

## 2021-03-08 ENCOUNTER — Telehealth: Payer: Self-pay | Admitting: Nurse Practitioner

## 2021-03-09 ENCOUNTER — Telehealth: Payer: Self-pay | Admitting: Nurse Practitioner

## 2021-03-09 DIAGNOSIS — L821 Other seborrheic keratosis: Secondary | ICD-10-CM | POA: Diagnosis not present

## 2021-03-09 DIAGNOSIS — L570 Actinic keratosis: Secondary | ICD-10-CM | POA: Diagnosis not present

## 2021-03-09 DIAGNOSIS — L814 Other melanin hyperpigmentation: Secondary | ICD-10-CM | POA: Diagnosis not present

## 2021-03-09 DIAGNOSIS — D225 Melanocytic nevi of trunk: Secondary | ICD-10-CM | POA: Diagnosis not present

## 2021-03-24 ENCOUNTER — Telehealth: Payer: Self-pay | Admitting: Nurse Practitioner

## 2021-03-24 NOTE — Telephone Encounter (Signed)
Left message for patient to call back and set up AWV

## 2021-05-02 DIAGNOSIS — M7989 Other specified soft tissue disorders: Secondary | ICD-10-CM | POA: Diagnosis not present

## 2021-05-02 DIAGNOSIS — S299XXA Unspecified injury of thorax, initial encounter: Secondary | ICD-10-CM | POA: Diagnosis not present

## 2021-05-02 DIAGNOSIS — S3993XA Unspecified injury of pelvis, initial encounter: Secondary | ICD-10-CM | POA: Diagnosis not present

## 2021-05-02 DIAGNOSIS — I214 Non-ST elevation (NSTEMI) myocardial infarction: Secondary | ICD-10-CM | POA: Diagnosis not present

## 2021-05-02 DIAGNOSIS — S8992XA Unspecified injury of left lower leg, initial encounter: Secondary | ICD-10-CM | POA: Diagnosis not present

## 2021-05-02 DIAGNOSIS — D72829 Elevated white blood cell count, unspecified: Secondary | ICD-10-CM | POA: Diagnosis not present

## 2021-05-02 DIAGNOSIS — R079 Chest pain, unspecified: Secondary | ICD-10-CM | POA: Diagnosis not present

## 2021-05-02 DIAGNOSIS — S20219A Contusion of unspecified front wall of thorax, initial encounter: Secondary | ICD-10-CM | POA: Diagnosis not present

## 2021-05-02 DIAGNOSIS — S0990XA Unspecified injury of head, initial encounter: Secondary | ICD-10-CM | POA: Diagnosis not present

## 2021-05-02 DIAGNOSIS — R778 Other specified abnormalities of plasma proteins: Secondary | ICD-10-CM | POA: Diagnosis not present

## 2021-05-02 DIAGNOSIS — S8991XA Unspecified injury of right lower leg, initial encounter: Secondary | ICD-10-CM | POA: Diagnosis not present

## 2021-05-02 DIAGNOSIS — S12400A Unspecified displaced fracture of fifth cervical vertebra, initial encounter for closed fracture: Secondary | ICD-10-CM | POA: Diagnosis not present

## 2021-05-02 DIAGNOSIS — S3991XA Unspecified injury of abdomen, initial encounter: Secondary | ICD-10-CM | POA: Diagnosis not present

## 2021-05-02 DIAGNOSIS — R6889 Other general symptoms and signs: Secondary | ICD-10-CM | POA: Diagnosis not present

## 2021-05-03 DIAGNOSIS — S12500A Unspecified displaced fracture of sixth cervical vertebra, initial encounter for closed fracture: Secondary | ICD-10-CM | POA: Insufficient documentation

## 2021-05-03 DIAGNOSIS — S299XXA Unspecified injury of thorax, initial encounter: Secondary | ICD-10-CM | POA: Diagnosis not present

## 2021-05-03 DIAGNOSIS — R9431 Abnormal electrocardiogram [ECG] [EKG]: Secondary | ICD-10-CM | POA: Diagnosis not present

## 2021-05-03 DIAGNOSIS — S3993XA Unspecified injury of pelvis, initial encounter: Secondary | ICD-10-CM | POA: Diagnosis not present

## 2021-05-03 DIAGNOSIS — N189 Chronic kidney disease, unspecified: Secondary | ICD-10-CM | POA: Diagnosis not present

## 2021-05-03 DIAGNOSIS — I214 Non-ST elevation (NSTEMI) myocardial infarction: Secondary | ICD-10-CM | POA: Diagnosis not present

## 2021-05-03 DIAGNOSIS — I129 Hypertensive chronic kidney disease with stage 1 through stage 4 chronic kidney disease, or unspecified chronic kidney disease: Secondary | ICD-10-CM | POA: Diagnosis present

## 2021-05-03 DIAGNOSIS — S8992XA Unspecified injury of left lower leg, initial encounter: Secondary | ICD-10-CM | POA: Diagnosis not present

## 2021-05-03 DIAGNOSIS — I6521 Occlusion and stenosis of right carotid artery: Secondary | ICD-10-CM | POA: Diagnosis not present

## 2021-05-03 DIAGNOSIS — M7989 Other specified soft tissue disorders: Secondary | ICD-10-CM | POA: Diagnosis not present

## 2021-05-03 DIAGNOSIS — D72829 Elevated white blood cell count, unspecified: Secondary | ICD-10-CM | POA: Diagnosis not present

## 2021-05-03 DIAGNOSIS — R079 Chest pain, unspecified: Secondary | ICD-10-CM | POA: Diagnosis not present

## 2021-05-03 DIAGNOSIS — I700 Atherosclerosis of aorta: Secondary | ICD-10-CM | POA: Diagnosis not present

## 2021-05-03 DIAGNOSIS — K219 Gastro-esophageal reflux disease without esophagitis: Secondary | ICD-10-CM | POA: Diagnosis present

## 2021-05-03 DIAGNOSIS — S12400A Unspecified displaced fracture of fifth cervical vertebra, initial encounter for closed fracture: Secondary | ICD-10-CM | POA: Insufficient documentation

## 2021-05-03 DIAGNOSIS — E860 Dehydration: Secondary | ICD-10-CM | POA: Diagnosis present

## 2021-05-03 DIAGNOSIS — G8911 Acute pain due to trauma: Secondary | ICD-10-CM | POA: Diagnosis present

## 2021-05-03 DIAGNOSIS — I6523 Occlusion and stenosis of bilateral carotid arteries: Secondary | ICD-10-CM | POA: Diagnosis not present

## 2021-05-03 DIAGNOSIS — R778 Other specified abnormalities of plasma proteins: Secondary | ICD-10-CM | POA: Diagnosis not present

## 2021-05-03 DIAGNOSIS — R55 Syncope and collapse: Secondary | ICD-10-CM | POA: Diagnosis not present

## 2021-05-03 DIAGNOSIS — Z981 Arthrodesis status: Secondary | ICD-10-CM | POA: Diagnosis not present

## 2021-05-03 DIAGNOSIS — I2510 Atherosclerotic heart disease of native coronary artery without angina pectoris: Secondary | ICD-10-CM | POA: Diagnosis not present

## 2021-05-03 DIAGNOSIS — Z28310 Unvaccinated for covid-19: Secondary | ICD-10-CM | POA: Diagnosis not present

## 2021-05-03 DIAGNOSIS — S8991XA Unspecified injury of right lower leg, initial encounter: Secondary | ICD-10-CM | POA: Diagnosis not present

## 2021-05-03 DIAGNOSIS — S20219A Contusion of unspecified front wall of thorax, initial encounter: Secondary | ICD-10-CM | POA: Diagnosis not present

## 2021-05-03 DIAGNOSIS — R748 Abnormal levels of other serum enzymes: Secondary | ICD-10-CM | POA: Diagnosis not present

## 2021-05-03 DIAGNOSIS — Z823 Family history of stroke: Secondary | ICD-10-CM | POA: Diagnosis not present

## 2021-05-03 DIAGNOSIS — I471 Supraventricular tachycardia: Secondary | ICD-10-CM | POA: Diagnosis present

## 2021-05-03 DIAGNOSIS — S0990XA Unspecified injury of head, initial encounter: Secondary | ICD-10-CM | POA: Diagnosis not present

## 2021-05-03 DIAGNOSIS — I440 Atrioventricular block, first degree: Secondary | ICD-10-CM | POA: Diagnosis present

## 2021-05-03 DIAGNOSIS — I358 Other nonrheumatic aortic valve disorders: Secondary | ICD-10-CM | POA: Diagnosis not present

## 2021-05-03 DIAGNOSIS — I3481 Nonrheumatic mitral (valve) annulus calcification: Secondary | ICD-10-CM | POA: Diagnosis not present

## 2021-05-03 DIAGNOSIS — S3991XA Unspecified injury of abdomen, initial encounter: Secondary | ICD-10-CM | POA: Diagnosis not present

## 2021-05-03 DIAGNOSIS — G8929 Other chronic pain: Secondary | ICD-10-CM | POA: Diagnosis not present

## 2021-05-03 DIAGNOSIS — I350 Nonrheumatic aortic (valve) stenosis: Secondary | ICD-10-CM | POA: Diagnosis not present

## 2021-05-04 ENCOUNTER — Telehealth: Payer: Self-pay | Admitting: Cardiology

## 2021-05-04 ENCOUNTER — Other Ambulatory Visit: Payer: Self-pay | Admitting: Physician Assistant

## 2021-05-04 DIAGNOSIS — I350 Nonrheumatic aortic (valve) stenosis: Secondary | ICD-10-CM

## 2021-05-04 NOTE — Telephone Encounter (Signed)
Returned a call to [employer] at [employer]. Patient is currently admitted and physician is requesting a gated CTA and TVR for the patient. Requested current inpatient records to be faxed to our office for Dr. Percival Spanish to review.

## 2021-05-04 NOTE — Telephone Encounter (Signed)
Dr hochrein called and spoke with the patient Referral to structural heart for aortic valve entered.

## 2021-05-04 NOTE — Telephone Encounter (Signed)
° °  Alex with [employer], she said pt needed gated CTA and TVR procedure, however, pt wants to do it at Dr. Rosezella [place] office. They wanted to know if this can be arranged, she said to call them [phone] or [phone]

## 2021-05-04 NOTE — Telephone Encounter (Signed)
Cristie Hem is calling back requesting to speak with Judson Roch. Wants to confirm the fax she sent was received at the [facility] office.

## 2021-05-04 NOTE — Telephone Encounter (Signed)
Faxed received and given to Dr. Rosezella Florida nurse.

## 2021-05-04 NOTE — Telephone Encounter (Signed)
Patient has a follow up appointment with dr Percival Spanish 05/17/21.

## 2021-05-12 ENCOUNTER — Encounter: Payer: Self-pay | Admitting: Internal Medicine

## 2021-05-12 ENCOUNTER — Other Ambulatory Visit: Payer: Self-pay

## 2021-05-12 ENCOUNTER — Telehealth: Payer: Self-pay | Admitting: Cardiology

## 2021-05-12 ENCOUNTER — Ambulatory Visit (HOSPITAL_COMMUNITY): Payer: Medicare Other | Attending: Internal Medicine

## 2021-05-12 ENCOUNTER — Ambulatory Visit (INDEPENDENT_AMBULATORY_CARE_PROVIDER_SITE_OTHER): Payer: Medicare Other | Admitting: Internal Medicine

## 2021-05-12 VITALS — BP 144/80 | HR 54 | Ht 72.0 in | Wt 170.0 lb

## 2021-05-12 DIAGNOSIS — S129XXD Fracture of neck, unspecified, subsequent encounter: Secondary | ICD-10-CM

## 2021-05-12 DIAGNOSIS — I350 Nonrheumatic aortic (valve) stenosis: Secondary | ICD-10-CM

## 2021-05-12 DIAGNOSIS — R55 Syncope and collapse: Secondary | ICD-10-CM

## 2021-05-12 LAB — ECHOCARDIOGRAM COMPLETE
AR max vel: 0.72 cm2
AV Area VTI: 0.81 cm2
AV Area mean vel: 0.73 cm2
AV Mean grad: 36.2 mmHg
AV Peak grad: 64.6 mmHg
Ao pk vel: 4.02 m/s
Area-P 1/2: 2.12 cm2
P 1/2 time: 835 msec
S' Lateral: 3.1 cm

## 2021-05-12 NOTE — Telephone Encounter (Signed)
°  [place] VALVE TEAM   Patient notified of Dr. Cyndia Bent appointment scheduled for 05/25/21 at 4pm. Instructions and directions given to patient and wife at office visit earlier in the day. Reviewed with patient on the phone today.    Kathyrn Drown NP-C Structural Heart Team  Pager: [phone] Phone: [phone]
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
## 2021-05-12 NOTE — Patient Instructions (Signed)
Medication Instructions:  No changes *If you need a refill on your cardiac medications before your next appointment, please call your pharmacy*   Lab Work: none   Testing/Procedures: Ct scans - see instruction letter provided.   Follow-Up: Per Structural Heart Valve Team  Other Instructions

## 2021-05-12 NOTE — Progress Notes (Signed)
Pre Surgical Assessment: 5 M Walk Test  45M=16.27ft  5 Meter Walk Test- trial 1: 4.54 seconds 5 Meter Walk Test- trial 2: 4.87 seconds 5 Meter Walk Test- trial 3: 4.42 seconds 5 Meter Walk Test Average: 4.61 seconds

## 2021-05-15 ENCOUNTER — Encounter (HOSPITAL_COMMUNITY): Payer: Self-pay

## 2021-05-15 ENCOUNTER — Ambulatory Visit (HOSPITAL_COMMUNITY)
Admission: RE | Admit: 2021-05-15 | Discharge: 2021-05-15 | Disposition: A | Discharge status: Home / Self Care | Payer: Medicare Other | Source: Ambulatory Visit | Attending: Internal Medicine | Admitting: Internal Medicine

## 2021-05-15 ENCOUNTER — Other Ambulatory Visit: Payer: Self-pay

## 2021-05-15 DIAGNOSIS — K8020 Calculus of gallbladder without cholecystitis without obstruction: Secondary | ICD-10-CM | POA: Diagnosis not present

## 2021-05-15 DIAGNOSIS — I700 Atherosclerosis of aorta: Secondary | ICD-10-CM | POA: Diagnosis not present

## 2021-05-15 DIAGNOSIS — R55 Syncope and collapse: Secondary | ICD-10-CM | POA: Insufficient documentation

## 2021-05-15 DIAGNOSIS — I350 Nonrheumatic aortic (valve) stenosis: Secondary | ICD-10-CM | POA: Diagnosis not present

## 2021-05-15 MED ORDER — IOHEXOL 350 MG/ML SOLN
95.0000 mL | Freq: Once | INTRAVENOUS | Status: AC | PRN
  Administered 2021-05-15: 95 mL via INTRAVENOUS

## 2021-05-15 NOTE — Addendum Note (Signed)
Addended by: Janan Halter F on: 05/15/2021 02:34 PM   Modules accepted: Orders

## 2021-05-17 ENCOUNTER — Other Ambulatory Visit: Payer: Self-pay

## 2021-05-17 ENCOUNTER — Ambulatory Visit: Payer: Medicare Other | Admitting: Cardiology

## 2021-05-17 ENCOUNTER — Ambulatory Visit
Admission: RE | Admit: 2021-05-17 | Discharge: 2021-05-17 | Disposition: A | Discharge status: Home / Self Care | Payer: Self-pay | Source: Ambulatory Visit | Attending: Internal Medicine | Admitting: Internal Medicine

## 2021-05-17 DIAGNOSIS — I350 Nonrheumatic aortic (valve) stenosis: Secondary | ICD-10-CM

## 2021-05-17 DIAGNOSIS — R55 Syncope and collapse: Secondary | ICD-10-CM

## 2021-05-18 DIAGNOSIS — S12491A Other nondisplaced fracture of fifth cervical vertebra, initial encounter for closed fracture: Secondary | ICD-10-CM | POA: Diagnosis not present

## 2021-05-22 ENCOUNTER — Encounter: Payer: Self-pay | Admitting: Physician Assistant

## 2021-05-23 ENCOUNTER — Other Ambulatory Visit: Payer: Self-pay

## 2021-05-23 DIAGNOSIS — I350 Nonrheumatic aortic (valve) stenosis: Secondary | ICD-10-CM

## 2021-05-25 ENCOUNTER — Encounter: Payer: Self-pay | Admitting: Surgery

## 2021-05-25 ENCOUNTER — Institutional Professional Consult (permissible substitution) (INDEPENDENT_AMBULATORY_CARE_PROVIDER_SITE_OTHER): Payer: Medicare Other | Admitting: Surgery

## 2021-05-25 ENCOUNTER — Other Ambulatory Visit: Payer: Self-pay

## 2021-05-25 VITALS — BP 138/76 | HR 62 | Resp 20 | Ht 72.0 in | Wt 170.0 lb

## 2021-05-25 DIAGNOSIS — I350 Nonrheumatic aortic (valve) stenosis: Secondary | ICD-10-CM | POA: Diagnosis not present

## 2021-05-25 NOTE — Progress Notes (Signed)
Surgical Instructions ? ? ? Your procedure is scheduled on 05/30/21. ? Report to [facility] Main Entrance "A" at 5:30 A.M., then check in with the Admitting office. ? Call this number if you have problems the morning of surgery: ? [phone] ? ? If you have any questions prior to your surgery date call [phone]: Open Monday-Friday 8am-4pm ? ? ? Remember: ? Do not eat or drink after midnight the night before your surgery ? ?  ?STOP now taking any Aleve, Naproxen, Ibuprofen, Motrin, Advil, Goody's, BC's, all herbal medications, fish oil, and all non-prescription vitamins.  ? ?Continue taking all other medications including Aspirin and  Plavix (clopidogrel) without change through the day before surgery. On the morning of surgery do not take any medications. ? ? ?         ?Do not wear jewelry or makeup ?Do not wear lotions, powders, perfumes/colognes, or deodorant. ?Men may shave face and neck. ?Do not bring valuables to the hospital. ?Do not wear nail polish, gel polish, artificial nails, or any other type of covering on natural nails (fingers and toes) ?If you have artificial nails or gel coating that need to be removed by a nail salon, please have this removed prior to surgery. Artificial nails or gel coating may interfere with anesthesia's ability to adequately monitor your vital signs. ? ?[facility] is not responsible for any belongings or valuables. .  ? ?Do NOT Smoke (Tobacco/Vaping)  24 hours prior to your procedure ? ?If you use a CPAP at night, you may bring your mask for your overnight stay. ?  ?Contacts, glasses, hearing aids, dentures or partials may not be worn into surgery, please bring cases for these belongings ?  ?For patients admitted to the hospital, discharge time will be determined by your treatment team. ?  ?Patients discharged the day of surgery will not be allowed to drive home, and someone needs to stay with them for 24 hours. ? ?NO VISITORS WILL BE ALLOWED IN PRE-OP WHERE PATIENTS ARE  PREPPED FOR SURGERY.  ONLY 1 SUPPORT PERSON MAY BE PRESENT IN THE WAITING ROOM WHILE YOU ARE IN SURGERY.  IF YOU ARE TO BE ADMITTED, ONCE YOU ARE IN YOUR ROOM YOU WILL BE ALLOWED TWO (2) VISITORS. 1 (ONE) VISITOR MAY STAY OVERNIGHT BUT MUST ARRIVE TO THE ROOM BY 8pm.  Minor children may have two parents present. Special consideration for safety and communication needs will be reviewed on a case by case basis. ? ?Special instructions:   ? ?Oral Hygiene is also important to reduce your risk of infection.  Remember - BRUSH YOUR TEETH THE MORNING OF SURGERY WITH YOUR REGULAR TOOTHPASTE ? ? ?[place]- Preparing For Surgery ? ?Before surgery, you can play an important role. Because skin is not sterile, your skin needs to be as free of germs as possible. You can reduce the number of germs on your skin by washing with CHG (chlorahexidine gluconate) Soap before surgery.  CHG is an antiseptic cleaner which kills germs and bonds with the skin to continue killing germs even after washing.   ? ? ?Please do not use if you have an allergy to CHG or antibacterial soaps. If your skin becomes reddened/irritated stop using the CHG.  ?Do not shave (including legs and underarms) for at least 48 hours prior to first CHG shower. It is OK to shave your face. ? ?Please follow these instructions carefully. ?  ? ? Shower the NIGHT BEFORE SURGERY and the MORNING OF SURGERY with  CHG Soap.  ? If you chose to wash your hair, wash your hair first as usual with your normal shampoo. After you shampoo, rinse your hair and body thoroughly to remove the shampoo.  Then [employer] and genitals (private parts) with your normal soap and rinse thoroughly to remove soap. ? ?After that Use CHG Soap as you would any other liquid soap. You can apply CHG directly to the skin and wash gently with a scrungie or a clean washcloth.  ? ?Apply the CHG Soap to your body ONLY FROM THE NECK DOWN.  Do not use on open wounds or open sores. Avoid contact with your eyes,  ears, mouth and genitals (private parts). Wash Face and genitals (private parts)  with your normal soap.  ? ?Wash thoroughly, paying special attention to the area where your surgery will be performed. ? ?Thoroughly rinse your body with warm water from the neck down. ? ?DO NOT shower/wash with your normal soap after using and rinsing off the CHG Soap. ? ?Pat yourself dry with a CLEAN TOWEL. ? ?Wear CLEAN PAJAMAS to bed the night before surgery ? ?Place CLEAN SHEETS on your bed the night before your surgery ? ?DO NOT SLEEP WITH PETS. ? ? ?Day of Surgery: ?Take a shower with CHG soap. ?Wear Clean/Comfortable clothing the morning of surgery ?Do not apply any deodorants/lotions.   ?Remember to brush your teeth WITH YOUR REGULAR TOOTHPASTE. ? ? ? ?COVID testing ? ?If you are going to stay overnight or be admitted after your procedure/surgery and require a pre-op COVID test, please follow these instructions after your COVID test  ? ?You are not required to quarantine however you are required to wear a well-fitting mask when you are out and around people not in your household.  If your mask becomes wet or soiled, replace with a new one. ? ?Wash your hands often with soap and water for 20 seconds or clean your hands with an alcohol-based hand sanitizer that contains at least 60% alcohol. ? ?Do not share personal items. ? ?Notify your provider: ?if you are in close contact with someone who has COVID  ?or if you develop a fever of 100.4 or greater, sneezing, cough, sore throat, shortness of breath or body aches. ? ?  ?Please read over the following fact sheets that you were given.  ? ?

## 2021-05-25 NOTE — Progress Notes (Signed)
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 External Iliac Artery -   Minimal Diameter-9.2 x 10.1 mm   Tortuosity-severe   Calcification-mild   Right Common Femoral Artery -   Minimal Diameter-9.4 x 7.7 mm   Tortuosity-mild   Calcification-mild   LEFT PELVIS:   Left Common Iliac Artery -   Minimal Diameter-10.4 x 10.7 mm    Tortuosity-mild   Calcification-severe   Left External Iliac Artery -   Minimal Diameter-8.9 x 9.1 mm   Tortuosity-severe   Calcification-mild   Left Common Femoral Artery -   Minimal Diameter-9.6 x 9.0 mm   Tortuosity-mild   Calcification-mild   Review of the MIP images confirms the above findings.   IMPRESSION: 1. Vascular findings and measurements pertinent to potential TAVR procedure, as detailed above. 2. Severe thickening calcification of the aortic valve, compatible with reported clinical history of severe aortic stenosis. 3. Aortic atherosclerosis, in addition to left main and 2 vessel coronary artery disease. 4. 7 mm low-attenuation lesion in the anterior aspect of the proximal pancreatic body, too small to characterize, but statistically likely to represent a tiny pancreatic pseudocyst or side branch IPMN (intraductal papillary mucinous neoplasm). Recommend follow up pre and post contrast MRI/MRCP or pancreatic protocol CT in 2 years. This recommendation follows ACR consensus guidelines: Management of Incidental Pancreatic Cysts: A White Paper of the ACR Incidental Findings Committee. [place] [id number]. 5. Cholelithiasis without evidence of acute cholecystitis at this time. 6. Additional incidental findings, as above.     Electronically Signed   By: Vinnie Langton M.D.   On: 05/16/2021 08:39     Impression:  This patient is an 88 year old gentleman with severe, symptomatic aortic stenosis presenting with NYHA class III-IV symptoms of dizziness and syncope at rest while driving his car.  He has had about a 82-month history of mild exertional fatigue.  I have personally reviewed  his 2D echocardiogram, cardiac catheterization report, and CTA studies.  His echo shows a heavily calcified aortic valve with restricted leaflet mobility.  The mean gradient is 36 mmHg with a valve area of 0.81 cm and a dimensionless index of 0.2 consistent with  severe aortic stenosis.  Left ventricular systolic function is normal.  His cardiac catheterization showed a high-grade mid RCA stenosis that was successfully treated with a DES on 05/03/2021.  He has an occluded right PDA with left-to-right collaterals and a high-grade stenosis of a small diagonal branch that can be treated medically.  I agree that aortic valve replacement is indicated in this patient for relief of his symptoms and to prevent further syncopal episodes and progressive left ventricular deterioration.  Given his age I think transcatheter aortic valve replacement would be the best treatment for him.  His gated cardiac CTA shows anatomy suitable for TAVR using a 29 mm SAPIEN 3 valve.  His abdominal and pelvic CTA shows adequate pelvic vascular anatomy to allow transfemoral insertion.  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  The patient and his wife were counseled at length regarding treatment alternatives for management of severe symptomatic aortic stenosis. The risks and benefits of surgical intervention has been discussed in detail. Long-term prognosis with medical therapy was discussed. Alternative approaches such as conventional surgical aortic valve replacement, transcatheter aortic valve replacement, and palliative medical therapy were compared and contrasted at length. This discussion was placed in the context of the patient's own specific clinical presentation and past medical history. All of their questions have been addressed.   Following the decision to proceed with transcatheter aortic valve replacement, a discussion was held regarding what types of management strategies would be attempted intraoperatively in the event of life-threatening complications, including whether or not the patient would be considered a candidate for the use of cardiopulmonary bypass  and/or conversion to open sternotomy for attempted surgical intervention.  He is in good overall condition for 88 years old and I think he would be a candidate for emergent sternotomy to manage any intraoperative complications.  The patient is aware of the fact that transient use of cardiopulmonary bypass may be necessary. The patient has been advised of a variety of complications that might develop including but not limited to risks of death, stroke, paravalvular leak, aortic dissection or other major vascular complications, aortic annulus rupture, device embolization, cardiac rupture or perforation, mitral regurgitation, acute myocardial infarction, arrhythmia, heart block or bradycardia requiring permanent pacemaker placement, congestive heart failure, respiratory failure, renal failure, pneumonia, infection, other late complications related to structural valve deterioration or migration, or other complications that might ultimately cause a temporary or permanent loss of functional independence or other long term morbidity. The patient provides full informed consent for the procedure as described and all questions were answered.      Plan:  He will be scheduled for transfemoral transcatheter aortic valve replacement using a SAPIEN 3 valve on Tuesday, 05/30/2021.  I spent 60 minutes performing this consultation and > 50% of this time was spent face to face counseling and coordinating the care of this patient's severe symptomatic aortic stenosis.   Gaye Pollack, MD 05/25/2021 4:58 PM

## 2021-05-26 ENCOUNTER — Encounter (HOSPITAL_COMMUNITY)
Admission: RE | Admit: 2021-05-26 | Discharge: 2021-05-26 | Disposition: A | Discharge status: Home / Self Care | Payer: Medicare Other | Source: Ambulatory Visit | Attending: Internal Medicine | Admitting: Internal Medicine

## 2021-05-26 ENCOUNTER — Encounter: Payer: Self-pay | Admitting: Surgery

## 2021-05-26 ENCOUNTER — Other Ambulatory Visit: Payer: Self-pay

## 2021-05-26 ENCOUNTER — Ambulatory Visit (HOSPITAL_COMMUNITY)
Admission: RE | Admit: 2021-05-26 | Discharge: 2021-05-26 | Disposition: A | Discharge status: Home / Self Care | Payer: Medicare Other | Source: Ambulatory Visit | Attending: Internal Medicine | Admitting: Internal Medicine

## 2021-05-26 ENCOUNTER — Encounter (HOSPITAL_COMMUNITY): Payer: Self-pay

## 2021-05-26 VITALS — BP 163/83 | HR 59 | Temp 98.3°F | Resp 18 | Ht 72.0 in | Wt 165.5 lb

## 2021-05-26 DIAGNOSIS — I700 Atherosclerosis of aorta: Secondary | ICD-10-CM | POA: Insufficient documentation

## 2021-05-26 DIAGNOSIS — I350 Nonrheumatic aortic (valve) stenosis: Secondary | ICD-10-CM

## 2021-05-26 DIAGNOSIS — M47814 Spondylosis without myelopathy or radiculopathy, thoracic region: Secondary | ICD-10-CM | POA: Diagnosis not present

## 2021-05-26 DIAGNOSIS — Z01818 Encounter for other preprocedural examination: Secondary | ICD-10-CM | POA: Diagnosis not present

## 2021-05-26 DIAGNOSIS — Z20822 Contact with and (suspected) exposure to covid-19: Secondary | ICD-10-CM | POA: Insufficient documentation

## 2021-05-26 LAB — URINALYSIS, ROUTINE W REFLEX MICROSCOPIC
Bilirubin Urine: NEGATIVE
Glucose, UA: NEGATIVE mg/dL
Hgb urine dipstick: NEGATIVE
Ketones, ur: 5 mg/dL — AB
Leukocytes,Ua: NEGATIVE
Nitrite: NEGATIVE
Protein, ur: NEGATIVE mg/dL
Specific Gravity, Urine: 1.017 (ref 1.005–1.030)
pH: 6 (ref 5.0–8.0)

## 2021-05-26 LAB — CBC
HCT: 42 % (ref 39.0–52.0)
Hemoglobin: 14 g/dL (ref 13.0–17.0)
MCH: 33.3 pg (ref 26.0–34.0)
MCHC: 33.3 g/dL (ref 30.0–36.0)
MCV: 100 fL (ref 80.0–100.0)
Platelets: 215 10*3/uL (ref 150–400)
RBC: 4.2 MIL/uL — ABNORMAL LOW (ref 4.22–5.81)
RDW: 12.7 % (ref 11.5–15.5)
WBC: 7.8 10*3/uL (ref 4.0–10.5)
nRBC: 0 % (ref 0.0–0.2)

## 2021-05-26 LAB — BLOOD GAS, ARTERIAL
Acid-Base Excess: 2.6 mmol/L — ABNORMAL HIGH (ref 0.0–2.0)
Bicarbonate: 27.2 mmol/L (ref 20.0–28.0)
Drawn by: 58793
O2 Saturation: 98.9 %
Patient temperature: 37
pCO2 arterial: 41 mmHg (ref 32–48)
pH, Arterial: 7.43 (ref 7.35–7.45)
pO2, Arterial: 99 mmHg (ref 83–108)

## 2021-05-26 LAB — SARS CORONAVIRUS 2 BY RT PCR (HOSPITAL ORDER, PERFORMED IN ~~LOC~~ HOSPITAL LAB): SARS Coronavirus 2: NEGATIVE

## 2021-05-26 LAB — COMPREHENSIVE METABOLIC PANEL
ALT: 13 U/L (ref 0–44)
AST: 23 U/L (ref 15–41)
Albumin: 3.8 g/dL (ref 3.5–5.0)
Alkaline Phosphatase: 129 U/L — ABNORMAL HIGH (ref 38–126)
Anion gap: 7 (ref 5–15)
BUN: 20 mg/dL (ref 8–23)
CO2: 23 mmol/L (ref 22–32)
Calcium: 9.4 mg/dL (ref 8.9–10.3)
Chloride: 104 mmol/L (ref 98–111)
Creatinine, Ser: 0.79 mg/dL (ref 0.61–1.24)
GFR, Estimated: >60 mL/min (ref >60)
Glucose, Bld: 115 mg/dL — ABNORMAL HIGH (ref 70–99)
Potassium: 4.8 mmol/L (ref 3.5–5.1)
Sodium: 134 mmol/L — ABNORMAL LOW (ref 135–145)
Total Bilirubin: 0.4 mg/dL (ref 0.3–1.2)
Total Protein: 7.4 g/dL (ref 6.5–8.1)

## 2021-05-26 LAB — TYPE AND SCREEN
ABO/RH(D): A NEG
Antibody Screen: NEGATIVE

## 2021-05-26 LAB — SURGICAL PCR SCREEN
MRSA, PCR: NEGATIVE
Staphylococcus aureus: NEGATIVE

## 2021-05-26 LAB — BRAIN NATRIURETIC PEPTIDE: B Natriuretic Peptide: 161.3 pg/mL — ABNORMAL HIGH (ref 0.0–100.0)

## 2021-05-26 LAB — PROTIME-INR
INR: 1.1 (ref 0.8–1.2)
Prothrombin Time: 14.2 seconds (ref 11.4–15.2)

## 2021-05-26 NOTE — Progress Notes (Signed)
PCP: Kerney Elbe, MD ?Cardiologist: Minus Breeding, MD ? ?EKG: 05/12/21 ?CXR: 05/26/21 ?ECHO: 05/12/21 ?Stress Test: approx 10 years ago ?Cardiac Cath: 05/17/21 ? ?Fasting Blood Sugar- na ?Checks Blood Sugar__na_ times a day ? ?ASA/Plavix: Continue through day before surgery.  ? ?OSA/CPAP: No ? ?Covid test 05/26/21 at PAT ? ?Anesthesia Review: Yes, cardiac history ? ?Patient denies shortness of breath, fever, cough, and chest pain at PAT appointment. ? ?Patient verbalized understanding of instructions provided today at the PAT appointment.  Patient asked to review instructions at home and day of surgery.   ?

## 2021-05-29 MED ORDER — HEPARIN 30,000 UNITS/1000 ML (OHS) CELLSAVER SOLUTION
Status: DC
  Filled 2021-05-29: qty 1000

## 2021-05-29 MED ORDER — MAGNESIUM SULFATE 50 % IJ SOLN
40.0000 meq | Freq: Once | INTRAMUSCULAR | Status: DC
  Filled 2021-05-29: qty 9.85

## 2021-05-29 MED ORDER — NOREPINEPHRINE 4 MG/250ML-% IV SOLN
0.0000 ug/min | Freq: Once | INTRAVENOUS | Status: AC
  Administered 2021-05-30: 1 ug/min via INTRAVENOUS
  Filled 2021-05-29: qty 250

## 2021-05-29 MED ORDER — DEXMEDETOMIDINE HCL IN NACL 400 MCG/100ML IV SOLN
0.1000 ug/kg/h | INTRAVENOUS | Status: AC
  Administered 2021-05-30: 75.12 ug via INTRAVENOUS
  Administered 2021-05-30: .7 ug/kg/h via INTRAVENOUS
  Filled 2021-05-29: qty 100

## 2021-05-29 MED ORDER — CEFAZOLIN SODIUM-DEXTROSE 2-4 GM/100ML-% IV SOLN
2.0000 g | Freq: Once | INTRAVENOUS | Status: AC
  Administered 2021-05-30: 2 g via INTRAVENOUS
  Filled 2021-05-29: qty 100

## 2021-05-29 MED ORDER — POTASSIUM CHLORIDE 2 MEQ/ML IV SOLN
80.0000 meq | INTRAVENOUS | Status: DC
  Filled 2021-05-29: qty 40

## 2021-05-29 NOTE — H&P (Signed)
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   Vascular/Lymphatic: Aortic atherosclerosis, with vascular findings and measurements pertinent to potential TAVR procedure, as detailed below. Ulcerated plaque in the right common iliac artery noted. No lymphadenopathy identified in the abdomen or pelvis.    Reproductive: Prostate gland and seminal vesicles are unremarkable in appearance.   Other: No significant volume of ascites.  No pneumoperitoneum.   Musculoskeletal: There are no aggressive appearing lytic or blastic lesions noted in the visualized portions of the skeleton.   VASCULAR MEASUREMENTS PERTINENT TO TAVR:   AORTA:   Minimal Aortic Diameter-16 x 13 mm   Severity of Aortic Calcification-severe   RIGHT PELVIS:   Right Common Iliac Artery -   Minimal Diameter-11.2 x 9.2 mm   Tortuosity-mild  Calcification-severe   Right External Iliac Artery -   Minimal Diameter-9.2 x 10.1 mm   Tortuosity-severe   Calcification-mild   Right Common Femoral Artery -   Minimal Diameter-9.4 x 7.7 mm   Tortuosity-mild   Calcification-mild   LEFT PELVIS:   Left Common Iliac Artery -   Minimal Diameter-10.4 x 10.7 mm   Tortuosity-mild   Calcification-severe   Left External Iliac Artery -   Minimal Diameter-8.9 x 9.1 mm   Tortuosity-severe   Calcification-mild   Left Common Femoral Artery -   Minimal Diameter-9.6 x 9.0 mm   Tortuosity-mild   Calcification-mild   Review of the MIP images confirms the above findings.   IMPRESSION: 1. Vascular findings and measurements pertinent to potential TAVR procedure, as detailed above. 2. Severe thickening calcification of the aortic valve, compatible with reported clinical history of severe aortic stenosis. 3. Aortic atherosclerosis, in addition to left main and 2 vessel coronary artery disease. 4. 7 mm low-attenuation lesion in the anterior aspect of the proximal pancreatic body, too small to characterize, but statistically likely to represent a tiny pancreatic pseudocyst or side branch IPMN (intraductal papillary mucinous neoplasm). Recommend follow up pre and post contrast MRI/MRCP or pancreatic protocol CT in 2 years. This recommendation follows ACR consensus guidelines: Management of Incidental Pancreatic  Cysts: A White Paper of the ACR Incidental Findings Committee. [place] 3335;[id number]. 5. Cholelithiasis without evidence of acute cholecystitis at this time. 6. Additional incidental findings, as above.     Electronically Signed   By: Vinnie Langton M.D.   On: 05/16/2021 08:39      Impression:   This patient is an 88 year old gentleman with severe, symptomatic aortic stenosis presenting with NYHA class III-IV symptoms of dizziness and syncope at rest while driving his car.  He has had about a 36-monthhistory of mild exertional fatigue.  I have personally reviewed his 2D echocardiogram, cardiac catheterization report, and CTA studies.  His echo shows a heavily calcified aortic valve with restricted leaflet mobility.  The mean gradient is 36 mmHg with a valve area of 0.81 cm and a dimensionless index of 0.2 consistent with severe aortic stenosis.  Left ventricular systolic function is normal.  His cardiac catheterization showed a high-grade mid RCA stenosis that was successfully treated with a DES on 05/03/2021.  He has an occluded right PDA with left-to-right collaterals and a high-grade stenosis of a small diagonal branch that can be treated medically.  I agree that aortic valve replacement is indicated in this patient for relief of his symptoms and to prevent further syncopal episodes and progressive left ventricular deterioration.  Given his age I think transcatheter aortic valve replacement would be the best treatment for him.  His gated cardiac CTA shows anatomy suitable for TAVR using a 29 mm SAPIEN 3 valve.  His abdominal and pelvic CTA shows adequate pelvic vascular anatomy to allow transfemoral insertion.  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   The patient and his wife were counseled at length regarding treatment alternatives for management of  severe symptomatic aortic stenosis. The risks and benefits of surgical intervention has been discussed in detail. Long-term prognosis with medical therapy was discussed. Alternative approaches such as conventional surgical aortic valve replacement, transcatheter aortic valve replacement, and palliative medical therapy were compared and contrasted at length. This discussion was placed in the  context of the patient's own specific clinical presentation and past medical history. All of their questions have been addressed.    Following the decision to proceed with transcatheter aortic valve replacement, a discussion was held regarding what types of management strategies would be attempted intraoperatively in the event of life-threatening complications, including whether or not the patient would be considered a candidate for the use of cardiopulmonary bypass and/or conversion to open sternotomy for attempted surgical intervention.  He is in good overall condition for 88 years old and I think he would be a candidate for emergent sternotomy to manage any intraoperative complications.  The patient is aware of the fact that transient use of cardiopulmonary bypass may be necessary. The patient has been advised of a variety of complications that might develop including but not limited to risks of death, stroke, paravalvular leak, aortic dissection or other major vascular complications, aortic annulus rupture, device embolization, cardiac rupture or perforation, mitral regurgitation, acute myocardial infarction, arrhythmia, heart block or bradycardia requiring permanent pacemaker placement, congestive heart failure, respiratory failure, renal failure, pneumonia, infection, other late complications related to structural valve deterioration or migration, or other complications that might ultimately cause a temporary or permanent loss of functional independence or other long term morbidity. The patient provides full informed  consent for the procedure as described and all questions were answered.       Plan:   Transfemoral transcatheter aortic valve replacement using a SAPIEN 3 valve.      Gaye Pollack, MD

## 2021-05-30 ENCOUNTER — Inpatient Hospital Stay (HOSPITAL_COMMUNITY)
Admission: RE | Admit: 2021-05-30 | Discharge: 2021-05-30 | Disposition: A | Discharge status: Home / Self Care | Payer: Medicare Other | Source: Home / Self Care | Attending: Internal Medicine | Admitting: Internal Medicine

## 2021-05-30 ENCOUNTER — Other Ambulatory Visit: Payer: Self-pay | Admitting: Physician Assistant

## 2021-05-30 ENCOUNTER — Inpatient Hospital Stay (HOSPITAL_COMMUNITY): Payer: Medicare Other | Admitting: Certified Registered Nurse Anesthetist

## 2021-05-30 ENCOUNTER — Other Ambulatory Visit: Payer: Self-pay

## 2021-05-30 ENCOUNTER — Inpatient Hospital Stay (HOSPITAL_COMMUNITY)
Admission: RE | Admit: 2021-05-30 | Discharge: 2021-05-31 | DRG: 267 | Disposition: A | Discharge status: Home / Self Care | Payer: Medicare Other | Attending: Internal Medicine | Admitting: Internal Medicine

## 2021-05-30 ENCOUNTER — Encounter (HOSPITAL_COMMUNITY)
Admission: RE | Disposition: A | Discharge status: Home / Self Care | Payer: Self-pay | Source: Home / Self Care | Attending: Internal Medicine

## 2021-05-30 ENCOUNTER — Inpatient Hospital Stay (HOSPITAL_COMMUNITY): Payer: Medicare Other | Admitting: Physician Assistant

## 2021-05-30 ENCOUNTER — Encounter (HOSPITAL_COMMUNITY): Payer: Self-pay | Admitting: Internal Medicine

## 2021-05-30 ENCOUNTER — Other Ambulatory Visit (HOSPITAL_COMMUNITY): Payer: Medicare Other

## 2021-05-30 DIAGNOSIS — I129 Hypertensive chronic kidney disease with stage 1 through stage 4 chronic kidney disease, or unspecified chronic kidney disease: Secondary | ICD-10-CM | POA: Diagnosis present

## 2021-05-30 DIAGNOSIS — Z79899 Other long term (current) drug therapy: Secondary | ICD-10-CM | POA: Diagnosis not present

## 2021-05-30 DIAGNOSIS — Z952 Presence of prosthetic heart valve: Secondary | ICD-10-CM

## 2021-05-30 DIAGNOSIS — Z006 Encounter for examination for normal comparison and control in clinical research program: Secondary | ICD-10-CM | POA: Diagnosis not present

## 2021-05-30 DIAGNOSIS — Z981 Arthrodesis status: Secondary | ICD-10-CM | POA: Diagnosis not present

## 2021-05-30 DIAGNOSIS — I10 Essential (primary) hypertension: Secondary | ICD-10-CM | POA: Diagnosis present

## 2021-05-30 DIAGNOSIS — Z8679 Personal history of other diseases of the circulatory system: Secondary | ICD-10-CM

## 2021-05-30 DIAGNOSIS — Z8709 Personal history of other diseases of the respiratory system: Secondary | ICD-10-CM

## 2021-05-30 DIAGNOSIS — I350 Nonrheumatic aortic (valve) stenosis: Principal | ICD-10-CM

## 2021-05-30 DIAGNOSIS — Z888 Allergy status to other drugs, medicaments and biological substances status: Secondary | ICD-10-CM | POA: Diagnosis not present

## 2021-05-30 DIAGNOSIS — I2510 Atherosclerotic heart disease of native coronary artery without angina pectoris: Secondary | ICD-10-CM | POA: Diagnosis not present

## 2021-05-30 DIAGNOSIS — Z9861 Coronary angioplasty status: Secondary | ICD-10-CM | POA: Diagnosis not present

## 2021-05-30 DIAGNOSIS — Z7902 Long term (current) use of antithrombotics/antiplatelets: Secondary | ICD-10-CM | POA: Diagnosis not present

## 2021-05-30 DIAGNOSIS — Z91018 Allergy to other foods: Secondary | ICD-10-CM | POA: Diagnosis not present

## 2021-05-30 DIAGNOSIS — Z87442 Personal history of urinary calculi: Secondary | ICD-10-CM | POA: Diagnosis not present

## 2021-05-30 DIAGNOSIS — R55 Syncope and collapse: Secondary | ICD-10-CM | POA: Diagnosis present

## 2021-05-30 DIAGNOSIS — N182 Chronic kidney disease, stage 2 (mild): Secondary | ICD-10-CM | POA: Diagnosis present

## 2021-05-30 DIAGNOSIS — Z7982 Long term (current) use of aspirin: Secondary | ICD-10-CM

## 2021-05-30 DIAGNOSIS — I352 Nonrheumatic aortic (valve) stenosis with insufficiency: Secondary | ICD-10-CM | POA: Diagnosis not present

## 2021-05-30 DIAGNOSIS — I6523 Occlusion and stenosis of bilateral carotid arteries: Secondary | ICD-10-CM | POA: Diagnosis present

## 2021-05-30 HISTORY — DX: Presence of prosthetic heart valve: Z95.2

## 2021-05-30 HISTORY — PX: TRANSCATHETER AORTIC VALVE REPLACEMENT, TRANSFEMORAL: SHX6400

## 2021-05-30 HISTORY — DX: Nonrheumatic aortic (valve) stenosis: I35.0

## 2021-05-30 HISTORY — PX: INTRAOPERATIVE TRANSTHORACIC ECHOCARDIOGRAM: SHX6523

## 2021-05-30 LAB — POCT I-STAT, CHEM 8
BUN: 22 mg/dL (ref 8–23)
BUN: 22 mg/dL (ref 8–23)
Calcium, Ion: 1.25 mmol/L (ref 1.15–1.40)
Calcium, Ion: 1.29 mmol/L (ref 1.15–1.40)
Chloride: 99 mmol/L (ref 98–111)
Chloride: 100 mmol/L (ref 98–111)
Creatinine, Ser: 0.7 mg/dL (ref 0.61–1.24)
Creatinine, Ser: 0.8 mg/dL (ref 0.61–1.24)
Glucose, Bld: 138 mg/dL — ABNORMAL HIGH (ref 70–99)
Glucose, Bld: 117 mg/dL — ABNORMAL HIGH (ref 70–99)
HCT: 39 % (ref 39.0–52.0)
HCT: 38 % — ABNORMAL LOW (ref 39.0–52.0)
Hemoglobin: 13.3 g/dL (ref 13.0–17.0)
Hemoglobin: 12.9 g/dL — ABNORMAL LOW (ref 13.0–17.0)
Potassium: 4.4 mmol/L (ref 3.5–5.1)
Potassium: 4.4 mmol/L (ref 3.5–5.1)
Sodium: 138 mmol/L (ref 135–145)
Sodium: 139 mmol/L (ref 135–145)
TCO2: 30 mmol/L (ref 22–32)
TCO2: 29 mmol/L (ref 22–32)

## 2021-05-30 LAB — ABO/RH: ABO/RH(D): A NEG

## 2021-05-30 LAB — ECHOCARDIOGRAM LIMITED
AR max vel: 1 cm2
AV Area VTI: 1 cm2
AV Area mean vel: 1.01 cm2
AV Mean grad: 20.5 mmHg
AV Peak grad: 26.8 mmHg
Ao pk vel: 2.59 m/s

## 2021-05-30 LAB — POCT I-STAT 7, (LYTES, BLD GAS, ICA,H+H)
Acid-Base Excess: 4 mmol/L — ABNORMAL HIGH (ref 0.0–2.0)
Bicarbonate: 30.1 mmol/L — ABNORMAL HIGH (ref 20.0–28.0)
Calcium, Ion: 1.27 mmol/L (ref 1.15–1.40)
HCT: 40 % (ref 39.0–52.0)
Hemoglobin: 13.6 g/dL (ref 13.0–17.0)
O2 Saturation: 100 %
Potassium: 4.4 mmol/L (ref 3.5–5.1)
Sodium: 137 mmol/L (ref 135–145)
TCO2: 32 mmol/L (ref 22–32)
pCO2 arterial: 52.7 mmHg — ABNORMAL HIGH (ref 32–48)
pH, Arterial: 7.365 (ref 7.35–7.45)
pO2, Arterial: 228 mmHg — ABNORMAL HIGH (ref 83–108)

## 2021-05-30 SURGERY — IMPLANTATION, AORTIC VALVE, TRANSCATHETER, FEMORAL APPROACH
Anesthesia: Monitor Anesthesia Care

## 2021-05-30 MED ORDER — OXYCODONE HCL 5 MG PO TABS: 5.0000 mg | ORAL_TABLET | ORAL | Status: DC | PRN

## 2021-05-30 MED ORDER — CLEVIDIPINE BUTYRATE 0.5 MG/ML IV EMUL
0.0000 mg/h | INTRAVENOUS | Status: DC
  Administered 2021-05-30: 10 mg/h via INTRAVENOUS
  Filled 2021-05-30 (×2): qty 50

## 2021-05-30 MED ORDER — LACTATED RINGERS IV SOLN: INTRAVENOUS | Status: DC

## 2021-05-30 MED ORDER — CHLORHEXIDINE GLUCONATE 0.12 % MT SOLN: 15.0000 mL | Freq: Once | OROMUCOSAL | Status: DC

## 2021-05-30 MED ORDER — SODIUM CHLORIDE 0.9 % IV SOLN: INTRAVENOUS | Status: AC

## 2021-05-30 MED ORDER — ESMOLOL HCL 100 MG/10ML IV SOLN
INTRAVENOUS | Status: DC | PRN
  Administered 2021-05-30: 20 mg via INTRAVENOUS
  Administered 2021-05-30: 30 mg via INTRAVENOUS
  Administered 2021-05-30: 20 mg via INTRAVENOUS

## 2021-05-30 MED ORDER — ORAL CARE MOUTH RINSE: 15.0000 mL | Freq: Once | OROMUCOSAL | Status: AC

## 2021-05-30 MED ORDER — NITROGLYCERIN IN D5W 200-5 MCG/ML-% IV SOLN: 0.0000 ug/min | INTRAVENOUS | Status: DC

## 2021-05-30 MED ORDER — CEFAZOLIN SODIUM-DEXTROSE 2-4 GM/100ML-% IV SOLN
2.0000 g | Freq: Three times a day (TID) | INTRAVENOUS | Status: AC
  Administered 2021-05-30 – 2021-05-31 (×2): 2 g via INTRAVENOUS
  Filled 2021-05-30 (×2): qty 100

## 2021-05-30 MED ORDER — MORPHINE SULFATE (PF) 2 MG/ML IV SOLN: 1.0000 mg | INTRAVENOUS | Status: DC | PRN

## 2021-05-30 MED ORDER — SODIUM CHLORIDE 0.9% FLUSH: 3.0000 mL | Freq: Two times a day (BID) | INTRAVENOUS | Status: DC

## 2021-05-30 MED ORDER — PROPOFOL 10 MG/ML IV BOLUS
INTRAVENOUS | Status: AC
  Filled 2021-05-30: qty 20

## 2021-05-30 MED ORDER — LIDOCAINE 2% (20 MG/ML) 5 ML SYRINGE
INTRAMUSCULAR | Status: DC | PRN
  Administered 2021-05-30: 40 mg via INTRAVENOUS

## 2021-05-30 MED ORDER — SODIUM CHLORIDE 0.9 % IV SOLN: INTRAVENOUS | Status: DC

## 2021-05-30 MED ORDER — ONDANSETRON HCL 4 MG/2ML IJ SOLN: 4.0000 mg | Freq: Four times a day (QID) | INTRAMUSCULAR | Status: DC | PRN

## 2021-05-30 MED ORDER — HEPARIN SODIUM (PORCINE) 1000 UNIT/ML IJ SOLN
INTRAMUSCULAR | Status: DC | PRN
  Administered 2021-05-30: 12000 [IU] via INTRAVENOUS

## 2021-05-30 MED ORDER — PROPOFOL 500 MG/50ML IV EMUL
INTRAVENOUS | Status: DC | PRN
  Administered 2021-05-30: 10 ug via INTRAVENOUS
  Administered 2021-05-30: 5 ug/kg/min via INTRAVENOUS
  Administered 2021-05-30: 10 ug via INTRAVENOUS

## 2021-05-30 MED ORDER — SODIUM CHLORIDE 0.9% FLUSH
3.0000 mL | INTRAVENOUS | Status: DC | PRN
Start: 2021-05-30 — End: 2021-05-31

## 2021-05-30 MED ORDER — CHLORHEXIDINE GLUCONATE 4 % EX LIQD: 60.0000 mL | Freq: Once | CUTANEOUS | Status: DC

## 2021-05-30 MED ORDER — PROTAMINE SULFATE 10 MG/ML IV SOLN
INTRAVENOUS | Status: DC | PRN
  Administered 2021-05-30: 120 mg via INTRAVENOUS

## 2021-05-30 MED ORDER — FENTANYL CITRATE (PF) 250 MCG/5ML IJ SOLN
INTRAMUSCULAR | Status: DC | PRN
  Administered 2021-05-30 (×2): 25 ug via INTRAVENOUS

## 2021-05-30 MED ORDER — LIDOCAINE HCL (PF) 1 % IJ SOLN
INTRAMUSCULAR | Status: AC
  Filled 2021-05-30: qty 30

## 2021-05-30 MED ORDER — MIDAZOLAM HCL 2 MG/2ML IJ SOLN
INTRAMUSCULAR | Status: AC
  Filled 2021-05-30: qty 2

## 2021-05-30 MED ORDER — PHENYLEPHRINE 40 MCG/ML (10ML) SYRINGE FOR IV PUSH (FOR BLOOD PRESSURE SUPPORT)
PREFILLED_SYRINGE | INTRAVENOUS | Status: DC | PRN
  Administered 2021-05-30 (×4): 40 ug via INTRAVENOUS
  Administered 2021-05-30: 20 ug via INTRAVENOUS

## 2021-05-30 MED ORDER — LACTATED RINGERS IV SOLN
INTRAVENOUS | Status: DC | PRN
Start: 2021-05-30 — End: 2021-05-30

## 2021-05-30 MED ORDER — ASPIRIN EC 81 MG PO TBEC
81.0000 mg | DELAYED_RELEASE_TABLET | Freq: Every day | ORAL | Status: DC
Start: 2021-05-30 — End: 2021-05-31
  Administered 2021-05-30 – 2021-05-31 (×2): 81 mg via ORAL
  Filled 2021-05-30 (×2): qty 1

## 2021-05-30 MED ORDER — IODIXANOL 320 MG/ML IV SOLN
INTRAVENOUS | Status: DC | PRN
Start: 2021-05-30 — End: 2021-05-30
  Administered 2021-05-30: 85 mL

## 2021-05-30 MED ORDER — CHLORHEXIDINE GLUCONATE 4 % EX LIQD: 30.0000 mL | CUTANEOUS | Status: DC

## 2021-05-30 MED ORDER — TRAMADOL HCL 50 MG PO TABS: 50.0000 mg | ORAL_TABLET | ORAL | Status: DC | PRN

## 2021-05-30 MED ORDER — CLOPIDOGREL BISULFATE 75 MG PO TABS
75.0000 mg | ORAL_TABLET | Freq: Every day | ORAL | Status: DC
  Administered 2021-05-30 – 2021-05-31 (×2): 75 mg via ORAL
  Filled 2021-05-30 (×2): qty 1

## 2021-05-30 MED ORDER — LIDOCAINE HCL 1 % IJ SOLN
INTRAMUSCULAR | Status: DC | PRN
  Administered 2021-05-30: 10 mL

## 2021-05-30 MED ORDER — EPHEDRINE SULFATE-NACL 50-0.9 MG/10ML-% IV SOSY
PREFILLED_SYRINGE | INTRAVENOUS | Status: DC | PRN
  Administered 2021-05-30: 5 mg via INTRAVENOUS

## 2021-05-30 MED ORDER — CHLORHEXIDINE GLUCONATE 0.12 % MT SOLN
OROMUCOSAL | Status: AC
  Administered 2021-05-30: 15 mL via OROMUCOSAL
  Filled 2021-05-30: qty 15

## 2021-05-30 MED ORDER — SODIUM CHLORIDE 0.9 % IV SOLN: 250.0000 mL | INTRAVENOUS | Status: DC | PRN

## 2021-05-30 MED ORDER — FENTANYL CITRATE (PF) 250 MCG/5ML IJ SOLN
INTRAMUSCULAR | Status: AC
  Filled 2021-05-30: qty 5

## 2021-05-30 MED ORDER — HEPARIN 6000 UNIT IRRIGATION SOLUTION
Status: AC
  Filled 2021-05-30: qty 1500

## 2021-05-30 MED ORDER — ACETAMINOPHEN 325 MG PO TABS: 650.0000 mg | ORAL_TABLET | Freq: Four times a day (QID) | ORAL | Status: DC | PRN

## 2021-05-30 MED ORDER — HEPARIN 6000 UNIT IRRIGATION SOLUTION
Status: DC | PRN
Start: 2021-05-30 — End: 2021-05-30
  Administered 2021-05-30: 3

## 2021-05-30 MED ORDER — CHLORHEXIDINE GLUCONATE 0.12 % MT SOLN: 15.0000 mL | Freq: Once | OROMUCOSAL | Status: AC

## 2021-05-30 MED ORDER — ACETAMINOPHEN 650 MG RE SUPP: 650.0000 mg | Freq: Four times a day (QID) | RECTAL | Status: DC | PRN

## 2021-05-30 SURGICAL SUPPLY — 63 items
ADH SKN CLS APL DERMABOND .7 (GAUZE/BANDAGES/DRESSINGS) ×4
APL PRP STRL LF DISP 70% ISPRP (MISCELLANEOUS) ×2
BAG COUNTER SPONGE SURGICOUNT (BAG) ×4 IMPLANT
BAG DECANTER FOR FLEXI CONT (MISCELLANEOUS) IMPLANT
BAG SPNG CNTER NS LX DISP (BAG) ×2
BAG SURGICOUNT SPONGE COUNTING (BAG) ×1
BLADE CLIPPER SURG (BLADE) IMPLANT
BLADE STERNUM SYSTEM 6 (BLADE) IMPLANT
CABLE ADAPT CONN TEMP 6FT (ADAPTER) ×5 IMPLANT
CANISTER SUCT 3000ML PPV (MISCELLANEOUS) IMPLANT
CATH DIAG EXPO 6F AL1 (CATHETERS) IMPLANT
CATH DIAG EXPO 6F VENT PIG 145 (CATHETERS) ×10 IMPLANT
CATH INFINITI 6F AL2 (CATHETERS) ×2 IMPLANT
CATH S G BIP PACING (CATHETERS) ×5 IMPLANT
CHLORAPREP W/TINT 26 (MISCELLANEOUS) ×5 IMPLANT
CLOSURE MYNX CONTROL 6F/7F (Vascular Products) ×2 IMPLANT
CNTNR URN SCR LID CUP LEK RST (MISCELLANEOUS) ×6 IMPLANT
CONT SPEC 4OZ STRL OR WHT (MISCELLANEOUS) ×8
COVER BACK TABLE 80X110 HD (DRAPES) IMPLANT
DECANTER SPIKE VIAL GLASS SM (MISCELLANEOUS) ×5 IMPLANT
DERMABOND ADVANCED (GAUZE/BANDAGES/DRESSINGS) ×4
DERMABOND ADVANCED .7 DNX12 (GAUZE/BANDAGES/DRESSINGS) ×3 IMPLANT
DEVICE CLOSURE PERCLS PRGLD 6F (VASCULAR PRODUCTS) ×6 IMPLANT
DRSG TEGADERM 4X4.75 (GAUZE/BANDAGES/DRESSINGS) ×10 IMPLANT
ELECT REM PT RETURN 9FT ADLT (ELECTROSURGICAL) ×4
ELECTRODE REM PT RTRN 9FT ADLT (ELECTROSURGICAL) ×3 IMPLANT
GAUZE SPONGE 4X4 12PLY STRL (GAUZE/BANDAGES/DRESSINGS) ×5 IMPLANT
GLOVE SURG ENC MOIS LTX SZ7.5 (GLOVE) IMPLANT
GLOVE SURG ENC MOIS LTX SZ8 (GLOVE) IMPLANT
GLOVE SURG ORTHO LTX SZ7.5 (GLOVE) IMPLANT
GOWN STRL REUS W/ TWL LRG LVL3 (GOWN DISPOSABLE) IMPLANT
GOWN STRL REUS W/ TWL XL LVL3 (GOWN DISPOSABLE) ×3 IMPLANT
GOWN STRL REUS W/TWL LRG LVL3 (GOWN DISPOSABLE)
GOWN STRL REUS W/TWL XL LVL3 (GOWN DISPOSABLE) ×4
GUIDEWIRE SAF TJ AMPL .035X180 (WIRE) ×5 IMPLANT
GUIDEWIRE SAFE TJ AMPLATZ EXST (WIRE) ×5 IMPLANT
KIT BASIN OR (CUSTOM PROCEDURE TRAY) ×5 IMPLANT
KIT HEART LEFT (KITS) ×5 IMPLANT
KIT TURNOVER KIT B (KITS) ×5 IMPLANT
NS IRRIG 1000ML POUR BTL (IV SOLUTION) ×5 IMPLANT
PACK ENDO MINOR (CUSTOM PROCEDURE TRAY) ×5 IMPLANT
PAD ARMBOARD 7.5X6 YLW CONV (MISCELLANEOUS) ×10 IMPLANT
PAD ELECT DEFIB RADIOL ZOLL (MISCELLANEOUS) ×5 IMPLANT
PERCLOSE PROGLIDE 6F (VASCULAR PRODUCTS) ×8
POSITIONER HEAD DONUT 9IN (MISCELLANEOUS) ×5 IMPLANT
SET MICROPUNCTURE 5F STIFF (MISCELLANEOUS) ×5 IMPLANT
SHEATH BRITE TIP 7FR 35CM (SHEATH) ×5 IMPLANT
SHEATH PINNACLE 6F 10CM (SHEATH) ×5 IMPLANT
SHEATH PINNACLE 8F 10CM (SHEATH) ×5 IMPLANT
SLEEVE REPOSITIONING LENGTH 30 (MISCELLANEOUS) ×5 IMPLANT
STOPCOCK MORSE 400PSI 3WAY (MISCELLANEOUS) ×10 IMPLANT
SUT PROLENE 6 0 C 1 30 (SUTURE) IMPLANT
SUT SILK  1 MH (SUTURE) ×4
SUT SILK 1 MH (SUTURE) ×3 IMPLANT
SYR 50ML LL SCALE MARK (SYRINGE) ×5 IMPLANT
SYR BULB IRRIG 60ML STRL (SYRINGE) IMPLANT
TOWEL GREEN STERILE (TOWEL DISPOSABLE) ×10 IMPLANT
TRANSDUCER W/STOPCOCK (MISCELLANEOUS) ×10 IMPLANT
TRAY FOLEY SLVR 14FR TEMP STAT (SET/KITS/TRAYS/PACK) IMPLANT
TUBE SUCT INTRACARD DLP 20F (MISCELLANEOUS) IMPLANT
VALVE HEART TRANSCATH SZ3 29MM (Valve) ×2 IMPLANT
WIRE EMERALD 3MM-J .035X150CM (WIRE) ×5 IMPLANT
WIRE EMERALD 3MM-J .035X260CM (WIRE) ×5 IMPLANT

## 2021-05-30 NOTE — Anesthesia Postprocedure Evaluation (Signed)
Anesthesia Post Note ? ?Patient: Joseph Potts. ? ?Procedure(s) Performed: Transcatheter Aortic Valve Replacement, Transfemoral (Left) ?INTRAOPERATIVE TRANSTHORACIC ECHOCARDIOGRAM ? ?  ? ?Patient location during evaluation: Cath Lab ?Anesthesia Type: MAC ?Level of consciousness: awake and alert ?Pain management: pain level controlled ?Vital Signs Assessment: post-procedure vital signs reviewed and stable ?Respiratory status: spontaneous breathing, nonlabored ventilation, respiratory function stable and patient connected to nasal cannula oxygen ?Cardiovascular status: stable and blood pressure returned to baseline ?Postop Assessment: no apparent nausea or vomiting ?Anesthetic complications: no ? ? ?No notable events documented. ? ?Last Vitals:  ?Vitals:  ? 05/30/21 1300 05/30/21 1620  ?BP: (!) 142/64 (!) 125/50  ?Pulse: (!) 57 (!) 54  ?Resp: 13 12  ?Temp:  (!) 36.3 ?C  ?SpO2: 99% 100%  ?  ?Last Pain:  ?Vitals:  ? 05/30/21 1620  ?TempSrc: Oral  ?PainSc:   ? ? ?  ?  ?  ?  ?  ?  ? ?[place] ? ? ? ? ?

## 2021-05-30 NOTE — Op Note (Signed)
[facility]   MULTIDISCIPLINARY HEART VALVE TEAM   TAVR OPERATIVE NOTE   Date of Procedure:  05/30/2021  Preoperative Diagnosis: Severe Aortic Stenosis   Postoperative Diagnosis: Same   Procedure:   Transcatheter Aortic Valve Replacement - Percutaneous Left Transfemoral Approach  Edwards Sapien 3 Ultra THV (size 29 mm, model # 9600TFX, serial # [id number])   Co-Surgeons:  Gaye Pollack, MD and Lenna Sciara, MD   Anesthesiologist:  A. Hodierne, MD  Echocardiographer:  P. Johnsie Cancel, MD  Pre-operative Echo Findings: Severe aortic stenosis Normal left ventricular systolic function  Post-operative Echo Findings: Trace paravalvular leak Normal left ventricular systolic function   BRIEF CLINICAL NOTE AND INDICATIONS FOR SURGERY  This patient is an 88 year old gentleman with severe, symptomatic aortic stenosis presenting with NYHA class III-IV symptoms of dizziness and syncope at rest while driving his car.  He has had about a 97-monthhistory of mild exertional fatigue.  I have personally reviewed his 2D echocardiogram, cardiac catheterization report, and CTA studies.  His echo shows a heavily calcified aortic valve with restricted leaflet mobility.  The mean gradient is 36 mmHg with a valve area of 0.81 cm and a dimensionless index of 0.2 consistent with severe aortic stenosis.  Left ventricular systolic function is normal.  His cardiac catheterization showed a high-grade mid RCA stenosis that was successfully treated with a DES on 05/03/2021.  He has an occluded right PDA with left-to-right collaterals and a high-grade stenosis of a small diagonal branch that can be treated medically.  I agree that aortic valve replacement is indicated in this patient for relief of his symptoms and to prevent further syncopal episodes and progressive left ventricular deterioration.  Given his age I think transcatheter aortic valve replacement would be the best treatment for him.  His gated  cardiac CTA shows anatomy suitable for TAVR using a 29 mm SAPIEN 3 valve.  His abdominal and pelvic CTA shows adequate pelvic vascular anatomy to allow transfemoral insertion.  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   The patient and his wife were counseled at length regarding treatment alternatives for management of severe symptomatic aortic stenosis. The risks and benefits of surgical intervention has been discussed in detail. Long-term prognosis with medical therapy was discussed. Alternative approaches such as conventional surgical aortic valve replacement, transcatheter aortic valve replacement, and palliative medical therapy were compared and contrasted at length. This discussion was placed in the context of the patient's own specific clinical presentation and past medical history. All of their questions have been addressed.    Following the decision to proceed with transcatheter aortic valve replacement, a discussion was held regarding what types of management strategies would be attempted intraoperatively in the event of life-threatening complications, including whether or not the patient would be considered a candidate for the use of cardiopulmonary bypass and/or conversion to open sternotomy for attempted surgical intervention.  He is in good overall condition for 88years old and I think he would be a candidate for emergent sternotomy to manage any intraoperative complications.  The patient is aware of the fact that transient use of cardiopulmonary bypass may be necessary. The patient has been advised of a variety of complications that might develop including but not limited to risks of death, stroke, paravalvular leak, aortic dissection or other major vascular complications, aortic annulus rupture, device embolization, cardiac rupture  or perforation, mitral  regurgitation, acute myocardial infarction, arrhythmia, heart block or bradycardia requiring permanent pacemaker placement, congestive heart failure, respiratory failure, renal failure, pneumonia, infection, other late complications related to structural valve deterioration or migration, or other complications that might ultimately cause a temporary or permanent loss of functional independence or other long term morbidity. The patient provides full informed consent for the procedure as described and all questions were answered.     DETAILS OF THE OPERATIVE PROCEDURE  PREPARATION:    The patient was brought to the operating room on the above mentioned date and appropriate monitoring was established by the anesthesia team. The patient was placed in the supine position on the operating table.  Intravenous antibiotics were administered. The patient was monitored closely throughout the procedure under conscious sedation.  Baseline transthoracic echocardiogram was performed. The patient's abdomen and both groins were prepped and draped in a sterile manner. A time out procedure was performed.   PERIPHERAL ACCESS:    Using the modified Seldinger technique, femoral arterial and venous access was obtained with placement of 6 Fr sheaths on the right side.  A pigtail diagnostic catheter was passed through the right arterial sheath under fluoroscopic guidance into the aortic root.  Aortic root angiography was performed in order to determine the optimal angiographic angle for valve deployment.   TRANSFEMORAL ACCESS:   Percutaneous transfemoral access and sheath placement was performed using ultrasound guidance.  The left common femoral artery was cannulated using a micropuncture needle and appropriate location was verified using hand injection angiogram.  A pair of Abbott Perclose percutaneous closure devices were placed and a 6 French sheath replaced into the femoral artery.  The patient was heparinized  systemically and ACT verified > 250 seconds.    A 16 Fr transfemoral E-sheath was introduced into the left common femoral artery after progressively dilating over an Amplatz superstiff wire. An AL-1 catheter was used to direct a straight-tip exchange length wire across the native aortic valve into the left ventricle. This was exchanged out for a pigtail catheter and position was confirmed in the LV apex. Simultaneous LV and Ao pressures were recorded.  The pigtail catheter was exchanged for a Safari wire in the LV apex. Direct LV pacing threshold through the [facility] wire were tested and adequate.   BALLOON AORTIC VALVULOPLASTY:   Not performed   TRANSCATHETER HEART VALVE DEPLOYMENT:   An Edwards Sapien 3 Ultra transcatheter heart valve (size 29 mm) was prepared and crimped per manufacturer's guidelines, and the proper orientation of the valve is confirmed on the [employer] delivery system. The valve was advanced through the introducer sheath using normal technique until in an appropriate position in the abdominal aorta beyond the sheath tip. The balloon was then retracted and using the fine-tuning wheel was centered on the valve. The valve was then advanced across the aortic arch using appropriate flexion of the catheter. The valve was carefully positioned across the aortic valve annulus. The Commander catheter was retracted using normal technique. Once final position of the valve has been confirmed by angiographic assessment, the valve is deployed while temporarily holding ventilation and during rapid ventricular pacing to maintain systolic blood pressure < 50 mmHg and pulse pressure < 10 mmHg. The balloon inflation is held for >3 seconds after reaching full deployment volume. Once the balloon has fully deflated the balloon is retracted into the ascending aorta and valve function is assessed using echocardiography. There is felt to be trace paravalvular leak and no central aortic  insufficiency.   The patient's hemodynamic recovery following valve deployment is good.  The deployment balloon and guidewire are both removed.    PROCEDURE COMPLETION:   The sheath was removed and femoral artery closure performed.  Protamine was administered once femoral arterial repair was complete. The temporary pacemaker, pigtail catheters and femoral sheaths were removed with manual pressure used for hemostasis.  A Mynx femoral closure device was utilized following removal of the diagnostic sheath in the right femoral artery.  The patient tolerated the procedure well and is transported to the cath lab recovery area in stable condition. There were no immediate intraoperative complications. All sponge instrument and needle counts are verified correct at completion of the operation.   No blood products were administered during the operation.  The patient received a total of 85 mL of intravenous contrast during the procedure.   Gaye Pollack, MD 05/30/2021

## 2021-05-30 NOTE — Progress Notes (Signed)
?  [facility]   ?MULTIDISCIPLINARY HEART VALVE TEAM ? ?Patient doing well s/p TAVR. He is hemodynamically stable. Groin sites stable. ECG with old 1st deg AV block and no high grade block. Arterial line discontinued and transferred to 4E. Plan for early ambulation after bedrest completed and hopeful discharge over the next 24-48 hours. Temp noted to be 95 rectally and bear hugger applied.  ? ?Angelena Form PA-C  [facility]  ?Pager [phone] ? ?

## 2021-05-30 NOTE — Transfer of Care (Signed)
Immediate Anesthesia Transfer of Care Note ? ?Patient: Joseph Potts. ? ?Procedure(s) Performed: Transcatheter Aortic Valve Replacement, Transfemoral (Left) ?INTRAOPERATIVE TRANSTHORACIC ECHOCARDIOGRAM ? ?Patient Location: PACU and Cath Lab ? ?Anesthesia Type:MAC ? ?Level of Consciousness: drowsy, patient cooperative and responds to stimulation ? ?Airway & Oxygen Therapy: Patient Spontanous Breathing ? ?Post-op Assessment: Report given to RN and Post -op Vital signs reviewed and stable ? ?Post vital signs: Reviewed and stable ? ?Last Vitals:  ?Vitals Value Taken Time  ?BP 107/47 05/30/21 0930  ?Temp 36.4 ?C 05/30/21 0929  ?Pulse 52 05/30/21 0933  ?Resp 9 05/30/21 0933  ?SpO2 100 % 05/30/21 0933  ?Vitals shown include unvalidated device data. ? ?Last Pain:  ?Vitals:  ? 05/30/21 0929  ?TempSrc: Temporal  ?PainSc: Asleep  ?   ? ?  ? ?Complications: No notable events documented. ?

## 2021-05-30 NOTE — Discharge Instructions (Signed)

## 2021-05-30 NOTE — Progress Notes (Signed)
Mobility Specialist Progress Note ? ? 05/30/21 1500  ?Mobility  ?Activity Ambulated with assistance in hallway  ?Level of Assistance Standby assist, set-up cues, supervision of patient - no hands on  ?Assistive Device Front wheel walker  ?Distance Ambulated (ft) 470 ft  ?Activity Response Tolerated well  ?$Mobility charge 1 Mobility  ? ?Groin sites holding well before and after mobility. Successful void prior to ambulation, No complaints or faults during ambulation. Used RW more so for safety and not necessity. Left w/ pt in bed w/ needs met and call bell by side. ? ?Holland Falling ?Mobility Specialist ?Phone Number [phone] ? ?

## 2021-05-30 NOTE — Interval H&P Note (Signed)
History and Physical Interval Note: ? ?05/30/2021 ?6:35 AM ? ?Joseph Potts.  has presented today for surgery, with the diagnosis of Severe Aortic Stenosis.  The various methods of treatment have been discussed with the patient and family. After consideration of risks, benefits and other options for treatment, the patient has consented to  Procedure(s): ?Transcatheter Aortic Valve Replacement, Transfemoral (Left) ?INTRAOPERATIVE TRANSTHORACIC ECHOCARDIOGRAM (N/A) as a surgical intervention.  The patient's history has been reviewed, patient examined, no change in status, stable for surgery.  I have reviewed the patient's chart and labs.  Questions were answered to the patient's satisfaction.   ? ? ?Gaye Pollack ? ? ?
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
## 2021-05-30 NOTE — Anesthesia Procedure Notes (Signed)
Arterial Line Insertion ?Start/End3/09/2021 7:05 AM ?Performed by: Janace Litten, CRNA, CRNA ? Patient location: Pre-op. ?Preanesthetic checklist: patient identified, IV checked, risks and benefits discussed, monitors and equipment checked and pre-op evaluation ?Lidocaine 1% used for infiltration ?Left, radial was placed ?Catheter size: 20 G ?Hand hygiene performed  and maximum sterile barriers used  ? ?Attempts: 1 ?Procedure performed without using ultrasound guided technique. ?Following insertion, dressing applied and Biopatch. ?Post procedure assessment: normal ? ?Patient tolerated the procedure well with no immediate complications. ? ? ?

## 2021-05-30 NOTE — Anesthesia Preprocedure Evaluation (Signed)
Anesthesia Evaluation  ?Patient identified by MRN, date of birth, ID band ?Patient awake ? ? ? ?Reviewed: ?Allergy & Precautions, H&P , NPO status , Patient's Chart, lab work & pertinent test results ? ?Airway ?Mallampati: II ? ? ?Neck ROM: full ? ? ? Dental ?  ?Pulmonary ?neg pulmonary ROS,  ?  ?breath sounds clear to auscultation ? ? ? ? ? ? Cardiovascular ?hypertension, + dysrhythmias + Valvular Problems/Murmurs AS  ?Rhythm:regular Rate:Normal ? ?Severe AS. Normal LV function. ?  ?Neuro/Psych ?  ? GI/Hepatic ?  ?Endo/Other  ? ? Renal/GU ?stones  ? ?  ?Musculoskeletal ? ? Abdominal ?  ?Peds ? Hematology ?  ?Anesthesia Other Findings ? ? Reproductive/Obstetrics ? ?  ? ? ? ? ? ? ? ? ? ? ? ? ? ?  ?  ? ? ? ? ? ? ? ? ?Anesthesia Physical ?Anesthesia Plan ? ?ASA: 3 ? ?Anesthesia Plan: MAC  ? ?Post-op Pain Management:   ? ?Induction: Intravenous ? ?PONV Risk Score and Plan: 1 and Ondansetron, Propofol infusion and Treatment may vary due to age or medical condition ? ?Airway Management Planned: Simple Face Mask ? ?Additional Equipment:  ? ?Intra-op Plan:  ? ?Post-operative Plan:  ? ?Informed Consent: I have reviewed the patients History and Physical, chart, labs and discussed the procedure including the risks, benefits and alternatives for the proposed anesthesia with the patient or authorized representative who has indicated his/her understanding and acceptance.  ? ? ? ?Dental advisory given ? ?Plan Discussed with: CRNA, Anesthesiologist and Surgeon ? ?Anesthesia Plan Comments:   ? ? ? ? ? ? ?Anesthesia Quick Evaluation ? ?

## 2021-05-31 ENCOUNTER — Inpatient Hospital Stay (HOSPITAL_COMMUNITY): Payer: Medicare Other

## 2021-05-31 ENCOUNTER — Encounter (HOSPITAL_COMMUNITY): Payer: Self-pay | Admitting: Internal Medicine

## 2021-05-31 DIAGNOSIS — Z952 Presence of prosthetic heart valve: Secondary | ICD-10-CM

## 2021-05-31 DIAGNOSIS — Z8679 Personal history of other diseases of the circulatory system: Secondary | ICD-10-CM

## 2021-05-31 LAB — ECHOCARDIOGRAM COMPLETE
AR max vel: 2.08 cm2
AV Area VTI: 2.43 cm2
AV Area mean vel: 2.16 cm2
AV Mean grad: 9 mmHg
AV Peak grad: 17.3 mmHg
Ao pk vel: 2.08 m/s
Area-P 1/2: 1.95 cm2
S' Lateral: 2.7 cm
Weight: 2611.2 oz

## 2021-05-31 LAB — BASIC METABOLIC PANEL
Anion gap: 6 (ref 5–15)
BUN: 20 mg/dL (ref 8–23)
CO2: 27 mmol/L (ref 22–32)
Calcium: 8.5 mg/dL — ABNORMAL LOW (ref 8.9–10.3)
Chloride: 99 mmol/L (ref 98–111)
Creatinine, Ser: 1.1 mg/dL (ref 0.61–1.24)
GFR, Estimated: >60 mL/min (ref >60)
Glucose, Bld: 125 mg/dL — ABNORMAL HIGH (ref 70–99)
Potassium: 4 mmol/L (ref 3.5–5.1)
Sodium: 132 mmol/L — ABNORMAL LOW (ref 135–145)

## 2021-05-31 LAB — CBC
HCT: 35.7 % — ABNORMAL LOW (ref 39.0–52.0)
Hemoglobin: 12.3 g/dL — ABNORMAL LOW (ref 13.0–17.0)
MCH: 33.3 pg (ref 26.0–34.0)
MCHC: 34.5 g/dL (ref 30.0–36.0)
MCV: 96.7 fL (ref 80.0–100.0)
Platelets: 151 10*3/uL (ref 150–400)
RBC: 3.69 MIL/uL — ABNORMAL LOW (ref 4.22–5.81)
RDW: 12.3 % (ref 11.5–15.5)
WBC: 11.5 10*3/uL — ABNORMAL HIGH (ref 4.0–10.5)
nRBC: 0 % (ref 0.0–0.2)

## 2021-05-31 LAB — MAGNESIUM: Magnesium: 1.9 mg/dL (ref 1.7–2.4)

## 2021-05-31 MED FILL — Magnesium Sulfate Inj 50%: INTRAMUSCULAR | Qty: 10 | Status: AC

## 2021-05-31 MED FILL — Heparin Sodium (Porcine) Inj 1000 Unit/ML: Qty: 1000 | Status: AC

## 2021-05-31 MED FILL — Potassium Chloride Inj 2 mEq/ML: INTRAVENOUS | Qty: 40 | Status: AC

## 2021-05-31 NOTE — Discharge Summary (Addendum)
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 Systemic Diam: 2.10 cm PJenkins RougeMD Electronically signed by PJenkins RougeMD Signature Date/Time: 05/30/2021/9:34:08 AM    Final  Structural Heart Procedure  Result Date: 05/30/2021 See surgical note for result.  CT ANGIO ABDOMEN PELVIS  W &/OR WO CONTRAST  Result Date: 05/16/2021 CLINICAL DATA:  88 year old male with history of severe aortic stenosis. Preprocedural study prior to potential transcatheter aortic valve replacement (TAVR) procedure. EXAM: CT ANGIOGRAPHY CHEST, ABDOMEN AND PELVIS TECHNIQUE: Multidetector CT imaging through the chest, abdomen and pelvis was performed using the standard protocol during bolus administration of intravenous contrast. Multiplanar reconstructed images and MIPs were obtained and reviewed to evaluate the vascular anatomy. RADIATION DOSE REDUCTION: This exam was performed according to the departmental dose-optimization program which includes automated exposure control, adjustment of the mA and/or kV according to patient size and/or use of iterative reconstruction technique. CONTRAST:  66m OMNIPAQUE IOHEXOL 350 MG/ML SOLN  COMPARISON:  No prior chest CT. CT the abdomen and pelvis 12/13/2014. FINDINGS: CTA CHEST FINDINGS Cardiovascular: Heart size is normal. There is no significant pericardial fluid, thickening or pericardial calcification. There is aortic atherosclerosis, as well as atherosclerosis of the great vessels of the mediastinum and the coronary arteries, including calcified atherosclerotic plaque in the left main, left anterior descending and right coronary arteries. Severe thickening and calcification of the aortic valve. Mild mitral annular calcifications. Mediastinum/Lymph Nodes: No pathologically enlarged mediastinal or hilar lymph nodes. Esophagus is unremarkable in appearance. No axillary lymphadenopathy. Lungs/Pleura: No acute consolidative airspace disease. No pleural effusions. No suspicious appearing pulmonary nodules or masses are noted. Musculoskeletal/Soft Tissues: There are no aggressive appearing lytic or blastic lesions noted in the visualized portions of the skeleton. Old healed fracture of the anterolateral aspect of the left fourth rib. Orthopedic fixation hardware in the lower cervical spine incidentally noted. CTA ABDOMEN AND PELVIS FINDINGS Hepatobiliary: No suspicious cystic or solid hepatic lesions. No intra or extrahepatic biliary ductal dilatation. Numerous calcified gallstones lying dependently in the gallbladder. No findings to suggest an acute cholecystitis are noted at this time. Pancreas: 7 mm low-attenuation lesion in the anterior aspect of the proximal pancreatic body (axial image 119 of series 4), nonspecific but new compared to the prior examination. No other pancreatic mass. No pancreatic ductal dilatation. No peripancreatic fluid collections or inflammatory changes. Spleen: Unremarkable. Adrenals/Urinary Tract: Bilateral kidneys and bilateral adrenal glands are normal in appearance. No hydroureteronephrosis. Urinary bladder is normal in appearance. Stomach/Bowel: The appearance of the  stomach is normal. No pathologic dilatation of small bowel or colon. Normal appendix. Vascular/Lymphatic: Aortic atherosclerosis, with vascular findings and measurements pertinent to potential TAVR procedure, as detailed below. Ulcerated plaque in the right common iliac artery noted. No lymphadenopathy identified in the abdomen or pelvis. Reproductive: Prostate gland and seminal vesicles are unremarkable in appearance. Other: No significant volume of ascites.  No pneumoperitoneum. Musculoskeletal: There are no aggressive appearing lytic or blastic lesions noted in the visualized portions of the skeleton. VASCULAR MEASUREMENTS PERTINENT TO TAVR: AORTA: Minimal Aortic Diameter-16 x 13 mm Severity of Aortic Calcification-severe RIGHT PELVIS: Right Common Iliac Artery - Minimal Diameter-11.2 x 9.2 mm Tortuosity-mild Calcification-severe Right External Iliac Artery - Minimal Diameter-9.2 x 10.1 mm Tortuosity-severe Calcification-mild Right Common Femoral Artery - Minimal Diameter-9.4 x 7.7 mm Tortuosity-mild Calcification-mild LEFT PELVIS: Left Common Iliac Artery - Minimal Diameter-10.4 x 10.7 mm Tortuosity-mild Calcification-severe Left External Iliac Artery - Minimal Diameter-8.9 x 9.1 mm Tortuosity-severe Calcification-mild Left Common Femoral Artery - Minimal Diameter-9.6 x 9.0 mm Tortuosity-mild Calcification-mild Review of the MIP images confirms the above findings. IMPRESSION: 1. Vascular findings and measurements pertinent to potential TAVR procedure, as detailed above. 2. Severe thickening  calcification of the aortic valve, compatible with reported clinical history of severe aortic stenosis. 3. Aortic atherosclerosis, in addition to left main and 2 vessel coronary artery disease. 4. 7 mm low-attenuation lesion in the anterior aspect of the proximal pancreatic body, too small to characterize, but statistically likely to represent a tiny pancreatic pseudocyst or side branch IPMN (intraductal papillary mucinous  neoplasm). Recommend follow up pre and post contrast MRI/MRCP or pancreatic protocol CT in 2 years. This recommendation follows ACR consensus guidelines: Management of Incidental Pancreatic Cysts: A White Paper of the ACR Incidental Findings Committee. [place] 6503;[id number]. 5. Cholelithiasis without evidence of acute cholecystitis at this time. 6. Additional incidental findings, as above. Electronically Signed   By: Vinnie Langton M.D.   On: 05/16/2021 08:39   Disposition   Pt is being discharged home today in good condition.  Follow-up Plans & Appointments     Follow-up Information     Eileen Stanford, PA-C. Go on 06/07/2021.   Specialties: Cardiology, Radiology Why: @ 3pm, please arrive at least 10 minutes early. Contact information: [address] [place] [address] [phone]                  Discharge Medications   Allergies as of 05/31/2021       Reactions   Ibuprofen Swelling   Ginger Swelling        Medication List     TAKE these medications    Alpha-Lipoic Acid 300 MG Caps Take 300 mg by mouth daily.   aspirin EC 81 MG tablet Take 1 tablet (81 mg total) by mouth daily. Swallow whole.   BERBERINE COMPLEX PO Take 1 capsule by mouth daily.   BETA GLUCAN PO Take 1 capsule by mouth daily.   CHROMIUM PICOLATE PO Take 200 mcg by mouth daily.   clopidogrel 75 MG tablet Commonly known as: PLAVIX Take 75 mg by mouth daily.   DHEA PO Take 25 mg by mouth daily.   MAGNESIUM GLUCONATE PO Take 2 capsules by mouth daily.   niacinamide 500 MG tablet Take 500 mg by mouth daily.   nystatin 500000 units Tabs tablet Commonly known as: MYCOSTATIN Take 500,000 Units by mouth daily.   OVER THE COUNTER MEDICATION Take 600 mg by mouth daily. Caprylic Acid   OVER THE COUNTER MEDICATION Take 1 capsule by mouth daily. Estrogen Control   OVER THE COUNTER MEDICATION Take 1 capsule by mouth daily. Inflammation Synergy    OVER THE COUNTER MEDICATION Take 1 capsule by mouth daily. Osteo Fix   OVER THE COUNTER MEDICATION Take 1 capsule by mouth daily. MSM Complex   OVER THE COUNTER MEDICATION Take 1 capsule by mouth daily. Curamed   OVER THE COUNTER MEDICATION Take 1 capsule by mouth daily. HDH Complex   PROBIOTIC-10 PO Take 1 capsule by mouth daily.   SELENIUM PO Take 3 capsules by mouth in the morning and at bedtime.   testosterone cypionate 200 MG/ML injection Commonly known as: DEPOTESTOSTERONE CYPIONATE Inject into the muscle.   trolamine salicylate 10 % cream Commonly known as: ASPERCREME Apply 1 application topically as needed for muscle pain.   Ubiquinol 100 MG Caps Take 100 mg by mouth daily.   UNABLE TO FIND Take 3 capsules by mouth in the morning and at bedtime. Essential Fatty Acids - 2 brands   vitamin C 500 MG tablet Commonly known as: ASCORBIC ACID Take 500 mg by mouth daily.   VITAMIN D3 PO  Take 8,000 Units by mouth daily.   VITAMIN K PO Take 1 tablet by mouth daily.   zinc gluconate 50 MG tablet Take 50 mg by mouth daily.          Outstanding Labs/Studies   none  Duration of Discharge Encounter   Greater than 30 minutes including physician time.  Signed, Angelena Form, PA-C 05/31/2021, 10:18 AM [phone]   ATTENDING ATTESTATION:  After conducting a review of all available clinical information with the care team, interviewing the patient, and performing a physical exam, I agree with the findings and plan described in this note.   GEN: No acute distress.   Cardiac: RRR, no murmurs, rubs, or gallops.  Respiratory: Clear to auscultation bilaterally. GI: Soft, nontender, non-distended  MS: No edema; No deformity. Neuro:  Nonfocal  Vasc:  +2 radial pulses, groins stable.  Patient doing well after uncomplicated TAVR with groins stable, no signs of TIA/CVA, and no conduction abnormalities.  TTE today with stable valve function.  D/C today with  follow up on DAPT due to recent PCI.  Lenna Sciara, MD Pager [phone]

## 2021-05-31 NOTE — Progress Notes (Deleted)
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 Systemic Diam: 2.10 cm PJenkins RougeMD Electronically signed by PJenkins RougeMD Signature Date/Time: 05/30/2021/9:34:08 AM    Final  Structural Heart Procedure  Result Date: 05/30/2021 See surgical note for result.  CT ANGIO ABDOMEN PELVIS  W &/OR WO CONTRAST  Result Date: 05/16/2021 CLINICAL DATA:  88 year old male with history of severe aortic stenosis. Preprocedural study prior to potential transcatheter aortic valve replacement (TAVR) procedure. EXAM: CT ANGIOGRAPHY CHEST, ABDOMEN AND PELVIS TECHNIQUE: Multidetector CT imaging through the chest, abdomen and pelvis was performed using the standard protocol during bolus administration of intravenous contrast. Multiplanar reconstructed images and MIPs were obtained and reviewed to evaluate the vascular anatomy. RADIATION DOSE REDUCTION: This exam was performed according to the departmental dose-optimization program which includes automated exposure control, adjustment of the mA and/or kV according to patient size and/or use of iterative reconstruction technique. CONTRAST:  60m OMNIPAQUE IOHEXOL 350 MG/ML SOLN  COMPARISON:  No prior chest CT. CT the abdomen and pelvis 12/13/2014. FINDINGS: CTA CHEST FINDINGS Cardiovascular: Heart size is normal. There is no significant pericardial fluid, thickening or pericardial calcification. There is aortic atherosclerosis, as well as atherosclerosis of the great vessels of the mediastinum and the coronary arteries, including calcified atherosclerotic plaque in the left main, left anterior descending and right coronary arteries. Severe thickening and calcification of the aortic valve. Mild mitral annular calcifications. Mediastinum/Lymph Nodes: No pathologically enlarged mediastinal or hilar lymph nodes. Esophagus is unremarkable in appearance. No axillary lymphadenopathy. Lungs/Pleura: No acute consolidative airspace disease. No pleural effusions. No suspicious appearing pulmonary nodules or masses are noted. Musculoskeletal/Soft Tissues: There are no aggressive appearing lytic or blastic lesions noted in the visualized portions of the skeleton. Old healed fracture of the anterolateral aspect of the left fourth rib. Orthopedic fixation hardware in the lower cervical spine incidentally noted. CTA ABDOMEN AND PELVIS FINDINGS Hepatobiliary: No suspicious cystic or solid hepatic lesions. No intra or extrahepatic biliary ductal dilatation. Numerous calcified gallstones lying dependently in the gallbladder. No findings to suggest an acute cholecystitis are noted at this time. Pancreas: 7 mm low-attenuation lesion in the anterior aspect of the proximal pancreatic body (axial image 119 of series 4), nonspecific but new compared to the prior examination. No other pancreatic mass. No pancreatic ductal dilatation. No peripancreatic fluid collections or inflammatory changes. Spleen: Unremarkable. Adrenals/Urinary Tract: Bilateral kidneys and bilateral adrenal glands are normal in appearance. No hydroureteronephrosis. Urinary bladder is normal in appearance. Stomach/Bowel: The appearance of the  stomach is normal. No pathologic dilatation of small bowel or colon. Normal appendix. Vascular/Lymphatic: Aortic atherosclerosis, with vascular findings and measurements pertinent to potential TAVR procedure, as detailed below. Ulcerated plaque in the right common iliac artery noted. No lymphadenopathy identified in the abdomen or pelvis. Reproductive: Prostate gland and seminal vesicles are unremarkable in appearance. Other: No significant volume of ascites.  No pneumoperitoneum. Musculoskeletal: There are no aggressive appearing lytic or blastic lesions noted in the visualized portions of the skeleton. VASCULAR MEASUREMENTS PERTINENT TO TAVR: AORTA: Minimal Aortic Diameter-16 x 13 mm Severity of Aortic Calcification-severe RIGHT PELVIS: Right Common Iliac Artery - Minimal Diameter-11.2 x 9.2 mm Tortuosity-mild Calcification-severe Right External Iliac Artery - Minimal Diameter-9.2 x 10.1 mm Tortuosity-severe Calcification-mild Right Common Femoral Artery - Minimal Diameter-9.4 x 7.7 mm Tortuosity-mild Calcification-mild LEFT PELVIS: Left Common Iliac Artery - Minimal Diameter-10.4 x 10.7 mm Tortuosity-mild Calcification-severe Left External Iliac Artery - Minimal Diameter-8.9 x 9.1 mm Tortuosity-severe Calcification-mild Left Common Femoral Artery - Minimal Diameter-9.6 x 9.0 mm Tortuosity-mild Calcification-mild Review of the MIP images confirms the above findings. IMPRESSION: 1. Vascular findings and measurements pertinent to potential TAVR procedure, as detailed above. 2. Severe thickening  calcification of the aortic valve, compatible with reported clinical history of severe aortic stenosis. 3. Aortic atherosclerosis, in addition to left main and 2 vessel coronary artery disease. 4. 7 mm low-attenuation lesion in the anterior aspect of the proximal pancreatic body, too small to characterize, but statistically likely to represent a tiny pancreatic pseudocyst or side branch IPMN (intraductal papillary mucinous  neoplasm). Recommend follow up pre and post contrast MRI/MRCP or pancreatic protocol CT in 2 years. This recommendation follows ACR consensus guidelines: Management of Incidental Pancreatic Cysts: A White Paper of the ACR Incidental Findings Committee. [place] [id number]. 5. Cholelithiasis without evidence of acute cholecystitis at this time. 6. Additional incidental findings, as above. Electronically Signed   By: Vinnie Langton M.D.   On: 05/16/2021 08:39   Disposition   Pt is being discharged home today in good condition.  Follow-up Plans & Appointments     Follow-up Information     Eileen Stanford, PA-C. Go on 06/07/2021.   Specialties: Cardiology, Radiology Why: @ 3pm, please arrive at least 10 minutes early. Contact information: [address] [place] [address] [phone]                  Discharge Medications   Allergies as of 05/31/2021       Reactions   Ibuprofen Swelling   Ginger Swelling        Medication List     TAKE these medications    Alpha-Lipoic Acid 300 MG Caps Take 300 mg by mouth daily.   aspirin EC 81 MG tablet Take 1 tablet (81 mg total) by mouth daily. Swallow whole.   BERBERINE COMPLEX PO Take 1 capsule by mouth daily.   BETA GLUCAN PO Take 1 capsule by mouth daily.   CHROMIUM PICOLATE PO Take 200 mcg by mouth daily.   clopidogrel 75 MG tablet Commonly known as: PLAVIX Take 75 mg by mouth daily.   DHEA PO Take 25 mg by mouth daily.   MAGNESIUM GLUCONATE PO Take 2 capsules by mouth daily.   niacinamide 500 MG tablet Take 500 mg by mouth daily.   nystatin 500000 units Tabs tablet Commonly known as: MYCOSTATIN Take 500,000 Units by mouth daily.   OVER THE COUNTER MEDICATION Take 600 mg by mouth daily. Caprylic Acid   OVER THE COUNTER MEDICATION Take 1 capsule by mouth daily. Estrogen Control   OVER THE COUNTER MEDICATION Take 1 capsule by mouth daily. Inflammation Synergy    OVER THE COUNTER MEDICATION Take 1 capsule by mouth daily. Osteo Fix   OVER THE COUNTER MEDICATION Take 1 capsule by mouth daily. MSM Complex   OVER THE COUNTER MEDICATION Take 1 capsule by mouth daily. Curamed   OVER THE COUNTER MEDICATION Take 1 capsule by mouth daily. HDH Complex   PROBIOTIC-10 PO Take 1 capsule by mouth daily.   SELENIUM PO Take 3 capsules by mouth in the morning and at bedtime.   testosterone cypionate 200 MG/ML injection Commonly known as: DEPOTESTOSTERONE CYPIONATE Inject into the muscle.   trolamine salicylate 10 % cream Commonly known as: ASPERCREME Apply 1 application topically as needed for muscle pain.   Ubiquinol 100 MG Caps Take 100 mg by mouth daily.   UNABLE TO FIND Take 3 capsules by mouth in the morning and at bedtime. Essential Fatty Acids - 2 brands   vitamin C 500 MG tablet Commonly known as: ASCORBIC ACID Take 500 mg by mouth daily.   VITAMIN D3 PO  Take 8,000 Units by mouth daily.   VITAMIN K PO Take 1 tablet by mouth daily.   zinc gluconate 50 MG tablet Take 50 mg by mouth daily.            Outstanding Labs/Studies   none  Duration of Discharge Encounter   Greater than 30 minutes including physician time.  SignedAngelena Form, PA-C 05/31/2021, 10:18 AM [phone]

## 2021-05-31 NOTE — TOC Transition Note (Signed)
Transition of Care (TOC) - CM/SW Discharge Note ?Marvetta Gibbons Therapist, sports, BSN ?Transitions of Care ?Unit 4E- RN Case Manager ?See Treatment Team for direct phone #  ? ? ?Patient Details  ?Name: Joseph Potts. ?MRN: [id number] ?Date of Birth: 1932/05/20 ? ?Transition of Care (TOC) CM/SW Contact:  ?Dahlia Client, Romeo Rabon, RN ?Phone Number: ?05/31/2021, 11:52 AM ? ? ?Clinical Narrative:    ?Pt s/p TAVR, stable for transition home today. Transition of Care Department [facility]) has reviewed patient and no TOC needs have been identified. ? ?Final next level of care: Home/Self Care ?Barriers to Discharge: No Barriers Identified ? ? ?Patient Goals and CMS Choice ?  ?  ?Choice offered to / list presented to : NA ? ?Discharge Placement ?  ?           ? Home ?  ?  ?  ? ?Discharge Plan and Services ?  ?  ?           ?DME Arranged: N/A ?DME Agency: NA ?  ?  ?  ?HH Arranged: NA ?[place] Agency: NA ?  ?  ?  ? ?Social Determinants of Health (SDOH) Interventions ?  ? ? ?Readmission Risk Interventions ?Readmission Risk Prevention Plan 05/31/2021  ?Post Dischage Appt Complete  ?Medication Screening Complete  ?Transportation Screening Complete  ?Some recent data might be hidden  ? ? ? ? ? ?

## 2021-05-31 NOTE — Progress Notes (Signed)
Echocardiogram ?2D Echocardiogram has been performed. ? ?Oneal Deputy Zerek Litsey RDCS ?05/31/2021, 10:02 AM ?

## 2021-05-31 NOTE — Progress Notes (Signed)
Please forward to [employer]. ?

## 2021-05-31 NOTE — Progress Notes (Signed)
CARDIAC REHAB PHASE I  ? ?PRE:  Rate/Rhythm: 71 SR ? ?  BP: sitting 134/60 ? ?  SaO2:  ? ?MODE:  Ambulation: 870 ft  ? ?POST:  Rate/Rhythm: 74 SR ? ?  BP: sitting 156/61  ? ?  SaO2:  ? ?Tolerated well, no c/o. To bed for echo. Discussed walking, restrictions, and CRPII. Pt not interested in CRPII due to distance.  ?[phone]  ? ?[place], ACSM ?05/31/2021 ?9:40 AM ? ? ? ? ?

## 2021-05-31 NOTE — Progress Notes (Signed)
D/C instructions given to patient and family. Wound care and medications reviewed. All questions answered. IV removed, clean and intact. Wife and daughter to escort pt home. ? ?Clyde Canterbury, RN ? ?

## 2021-06-01 ENCOUNTER — Telehealth: Payer: Self-pay | Admitting: Physician Assistant

## 2021-06-01 NOTE — Telephone Encounter (Signed)
?  [facility]   ?MULTIDISCIPLINARY HEART VALVE TEAM ? ? ?Patient contacted regarding discharge from [facility] on 3/8 ? ?Patient understands to follow up with a structural heart APP on 3/15 at [facility].  ?Patient understands discharge instructions? yes ?Patient understands medications and regimen? yes ?Patient understands to bring all medications to this visit? yes ? ?Angelena Form PA-C  [facility]  ? ? ? ?

## 2021-06-01 NOTE — Progress Notes (Signed)
We will stop ordering ?

## 2021-06-05 ENCOUNTER — Encounter: Payer: Self-pay | Admitting: Cardiology

## 2021-06-05 ENCOUNTER — Telehealth: Payer: Self-pay | Admitting: Physician Assistant

## 2021-06-05 NOTE — Telephone Encounter (Signed)
?  Patient states he lost the bandage on the left side of his groin and he has a hole. He wants to know if he needs to do anything for it. He does have appointment on 06/07/21 ?

## 2021-06-05 NOTE — Telephone Encounter (Signed)
Spoke with patient.  He was worried as when his bandage fell off he saw a hole.  There is no drainage or swelling and it is not painful.  He placed band aid over the area and has a follow up appointment 06/07/21 at 3pm.   ?

## 2021-06-05 NOTE — Telephone Encounter (Signed)
error 
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
## 2021-06-07 ENCOUNTER — Other Ambulatory Visit: Payer: Self-pay

## 2021-06-07 ENCOUNTER — Ambulatory Visit (INDEPENDENT_AMBULATORY_CARE_PROVIDER_SITE_OTHER): Payer: Medicare Other | Admitting: Physician Assistant

## 2021-06-07 VITALS — BP 108/60 | HR 70 | Ht 72.0 in | Wt 163.0 lb

## 2021-06-07 DIAGNOSIS — R002 Palpitations: Secondary | ICD-10-CM | POA: Diagnosis not present

## 2021-06-07 DIAGNOSIS — K869 Disease of pancreas, unspecified: Secondary | ICD-10-CM | POA: Diagnosis not present

## 2021-06-07 DIAGNOSIS — Z9861 Coronary angioplasty status: Secondary | ICD-10-CM

## 2021-06-07 DIAGNOSIS — I2510 Atherosclerotic heart disease of native coronary artery without angina pectoris: Secondary | ICD-10-CM

## 2021-06-07 DIAGNOSIS — Z952 Presence of prosthetic heart valve: Secondary | ICD-10-CM | POA: Diagnosis not present

## 2021-06-07 DIAGNOSIS — I6523 Occlusion and stenosis of bilateral carotid arteries: Secondary | ICD-10-CM

## 2021-06-07 DIAGNOSIS — I10 Essential (primary) hypertension: Secondary | ICD-10-CM

## 2021-06-07 NOTE — Patient Instructions (Signed)
Medication Instructions:  ?Your physician recommends that you continue on your current medications as directed. Please refer to the Current Medication list given to you today. ? ?*If you need a refill on your cardiac medications before your next appointment, please call your pharmacy* ? ? ?Lab Work: ?None ordered  ? ?If you have labs (blood work) drawn today and your tests are completely normal, you will receive your results only by: ?MyChart Message (if you have MyChart) OR ?A paper copy in the mail ?If you have any lab test that is abnormal or we need to change your treatment, we will call you to review the results. ? ? ?Testing/Procedures: ?None ordered  ? ? ?Follow-Up: ?Follow up as scheduled  ? ?Other Instructions ?OK to resume normal activities   ?

## 2021-06-09 DIAGNOSIS — Z20822 Contact with and (suspected) exposure to covid-19: Secondary | ICD-10-CM | POA: Diagnosis not present

## 2021-06-15 DIAGNOSIS — S12491A Other nondisplaced fracture of fifth cervical vertebra, initial encounter for closed fracture: Secondary | ICD-10-CM | POA: Diagnosis not present

## 2021-06-16 DIAGNOSIS — Z20822 Contact with and (suspected) exposure to covid-19: Secondary | ICD-10-CM | POA: Diagnosis not present

## 2021-06-22 DIAGNOSIS — Z20828 Contact with and (suspected) exposure to other viral communicable diseases: Secondary | ICD-10-CM | POA: Diagnosis not present

## 2021-06-23 DIAGNOSIS — Z20822 Contact with and (suspected) exposure to covid-19: Secondary | ICD-10-CM | POA: Diagnosis not present

## 2021-06-27 DIAGNOSIS — Z20822 Contact with and (suspected) exposure to covid-19: Secondary | ICD-10-CM | POA: Diagnosis not present

## 2021-06-28 ENCOUNTER — Ambulatory Visit (HOSPITAL_COMMUNITY): Payer: Medicare Other | Attending: Cardiovascular Disease

## 2021-06-28 ENCOUNTER — Ambulatory Visit (INDEPENDENT_AMBULATORY_CARE_PROVIDER_SITE_OTHER): Payer: Medicare Other | Admitting: Physician Assistant

## 2021-06-28 VITALS — BP 106/60 | HR 62 | Ht 72.0 in | Wt 165.2 lb

## 2021-06-28 DIAGNOSIS — Z9861 Coronary angioplasty status: Secondary | ICD-10-CM

## 2021-06-28 DIAGNOSIS — I6523 Occlusion and stenosis of bilateral carotid arteries: Secondary | ICD-10-CM

## 2021-06-28 DIAGNOSIS — Z952 Presence of prosthetic heart valve: Secondary | ICD-10-CM

## 2021-06-28 DIAGNOSIS — I10 Essential (primary) hypertension: Secondary | ICD-10-CM | POA: Diagnosis not present

## 2021-06-28 DIAGNOSIS — R002 Palpitations: Secondary | ICD-10-CM | POA: Diagnosis not present

## 2021-06-28 DIAGNOSIS — I2510 Atherosclerotic heart disease of native coronary artery without angina pectoris: Secondary | ICD-10-CM

## 2021-06-28 DIAGNOSIS — K869 Disease of pancreas, unspecified: Secondary | ICD-10-CM | POA: Diagnosis not present

## 2021-06-28 LAB — ECHOCARDIOGRAM COMPLETE
AR max vel: 2.21 cm2
AV Area VTI: 2.56 cm2
AV Area mean vel: 2.38 cm2
AV Mean grad: 9.5 mmHg
AV Peak grad: 19.6 mmHg
Ao pk vel: 2.22 m/s
Area-P 1/2: 4.02 cm2
S' Lateral: 2.7 cm
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
## 2021-06-28 NOTE — Patient Instructions (Signed)
Medication Instructions:  ?Your physician recommends that you continue on your current medications as directed. Please refer to the Current Medication list given to you today. ? ?*If you need a refill on your cardiac medications before your next appointment, please call your pharmacy* ? ? ?Lab Work: ?None ordered  ? ?If you have labs (blood work) drawn today and your tests are completely normal, you will receive your results only by: ?MyChart Message (if you have MyChart) OR ?A paper copy in the mail ?If you have any lab test that is abnormal or we need to change your treatment, we will call you to review the results. ? ? ?Testing/Procedures: ?Your physician has requested that you have an echocardiogram in March 2024 . Echocardiography is a painless test that uses sound waves to create images of your heart. It provides your doctor with information about the size and shape of your heart and how well your heart?s chambers and valves are working. This procedure takes approximately one hour. There are no restrictions for this procedure. ? ? ? ?Follow-Up: ?Follow up as scheduled }  ? ? ?Other Instructions ?None   ?

## 2021-07-10 DIAGNOSIS — Z20822 Contact with and (suspected) exposure to covid-19: Secondary | ICD-10-CM | POA: Diagnosis not present

## 2021-07-24 DIAGNOSIS — Z20822 Contact with and (suspected) exposure to covid-19: Secondary | ICD-10-CM | POA: Diagnosis not present

## 2021-07-25 DIAGNOSIS — M545 Low back pain, unspecified: Secondary | ICD-10-CM | POA: Diagnosis not present

## 2021-07-25 DIAGNOSIS — M25552 Pain in left hip: Secondary | ICD-10-CM | POA: Diagnosis not present

## 2021-07-28 DIAGNOSIS — Z20822 Contact with and (suspected) exposure to covid-19: Secondary | ICD-10-CM | POA: Diagnosis not present

## 2021-07-28 DIAGNOSIS — Z20828 Contact with and (suspected) exposure to other viral communicable diseases: Secondary | ICD-10-CM | POA: Diagnosis not present

## 2021-07-31 DIAGNOSIS — Z20822 Contact with and (suspected) exposure to covid-19: Secondary | ICD-10-CM | POA: Diagnosis not present

## 2021-08-02 DIAGNOSIS — Z20822 Contact with and (suspected) exposure to covid-19: Secondary | ICD-10-CM | POA: Diagnosis not present
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
## 2021-12-20 ENCOUNTER — Other Ambulatory Visit: Payer: Self-pay | Admitting: *Deleted

## 2021-12-20 ENCOUNTER — Encounter: Payer: Self-pay | Admitting: Cardiology

## 2021-12-20 ENCOUNTER — Ambulatory Visit (INDEPENDENT_AMBULATORY_CARE_PROVIDER_SITE_OTHER): Payer: Medicare Other | Admitting: Cardiology

## 2021-12-20 VITALS — BP 138/70 | HR 64 | Ht 72.0 in | Wt 163.0 lb

## 2021-12-20 DIAGNOSIS — I2510 Atherosclerotic heart disease of native coronary artery without angina pectoris: Secondary | ICD-10-CM

## 2021-12-20 DIAGNOSIS — R000 Tachycardia, unspecified: Secondary | ICD-10-CM

## 2021-12-20 DIAGNOSIS — I6523 Occlusion and stenosis of bilateral carotid arteries: Secondary | ICD-10-CM

## 2021-12-20 DIAGNOSIS — I10 Essential (primary) hypertension: Secondary | ICD-10-CM | POA: Diagnosis not present

## 2021-12-20 DIAGNOSIS — H25811 Combined forms of age-related cataract, right eye: Secondary | ICD-10-CM | POA: Diagnosis not present

## 2021-12-20 DIAGNOSIS — H43812 Vitreous degeneration, left eye: Secondary | ICD-10-CM | POA: Diagnosis not present

## 2021-12-20 DIAGNOSIS — Z79899 Other long term (current) drug therapy: Secondary | ICD-10-CM | POA: Diagnosis not present

## 2021-12-20 DIAGNOSIS — Z952 Presence of prosthetic heart valve: Secondary | ICD-10-CM

## 2021-12-20 DIAGNOSIS — H02834 Dermatochalasis of left upper eyelid: Secondary | ICD-10-CM | POA: Diagnosis not present

## 2021-12-20 DIAGNOSIS — Z961 Presence of intraocular lens: Secondary | ICD-10-CM | POA: Diagnosis not present

## 2021-12-20 DIAGNOSIS — H04123 Dry eye syndrome of bilateral lacrimal glands: Secondary | ICD-10-CM | POA: Diagnosis not present

## 2021-12-20 DIAGNOSIS — H02831 Dermatochalasis of right upper eyelid: Secondary | ICD-10-CM | POA: Diagnosis not present

## 2021-12-20 MED ORDER — PRAVASTATIN SODIUM 40 MG PO TABS: 40.0000 mg | ORAL_TABLET | Freq: Every evening | ORAL | 3 refills | Status: DC

## 2021-12-20 NOTE — Patient Instructions (Signed)
Medication Instructions:  Please start Pravastatin 40 mg a day. Continue all other medications as listed.  *If you need a refill on your cardiac medications before your next appointment, please call your pharmacy*   Lab Work: Please have Lipid Panel in 3 months at [facility].  If you have labs (blood work) drawn today and your tests are completely normal, you will receive your results only by: [place] (if you have MyChart) OR A paper copy in the mail If you have any lab test that is abnormal or we need to change your treatment, we will call you to review the results.   Follow-Up: At [facility], you and your health needs are our priority.  As part of our continuing mission to provide you with exceptional heart care, we have created designated Provider Care Teams.  These Care Teams include your primary Cardiologist (physician) and Advanced Practice Providers (APPs -  Physician Assistants and Nurse Practitioners) who all work together to provide you with the care you need, when you need it.  We recommend signing up for the patient portal called "MyChart".  Sign up information is provided on this After Visit Summary.  MyChart is used to connect with patients for Virtual Visits (Telemedicine).  Patients are able to view lab/test results, encounter notes, upcoming appointments, etc.  Non-urgent messages can be sent to your provider as well.   To learn more about what you can do with MyChart, go to [email or web address].    Your next appointment:   1 year(s)  The format for your next appointment:   In Person  Provider:   Minus Breeding, MD     Important Information About Sugar

## 2022-03-14 DIAGNOSIS — L814 Other melanin hyperpigmentation: Secondary | ICD-10-CM | POA: Diagnosis not present

## 2022-03-14 DIAGNOSIS — L821 Other seborrheic keratosis: Secondary | ICD-10-CM | POA: Diagnosis not present

## 2022-03-14 DIAGNOSIS — L80 Vitiligo: Secondary | ICD-10-CM | POA: Diagnosis not present

## 2022-04-12 DIAGNOSIS — M5414 Radiculopathy, thoracic region: Secondary | ICD-10-CM | POA: Diagnosis not present

## 2022-05-02 DIAGNOSIS — M47816 Spondylosis without myelopathy or radiculopathy, lumbar region: Secondary | ICD-10-CM | POA: Diagnosis not present

## 2022-05-02 DIAGNOSIS — M791 Myalgia, unspecified site: Secondary | ICD-10-CM | POA: Diagnosis not present

## 2022-05-04 DIAGNOSIS — M545 Low back pain, unspecified: Secondary | ICD-10-CM | POA: Diagnosis not present

## 2022-06-01 ENCOUNTER — Other Ambulatory Visit (HOSPITAL_COMMUNITY): Payer: Medicare Other

## 2022-06-01 ENCOUNTER — Ambulatory Visit: Payer: Medicare Other
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
## 2022-06-08 ENCOUNTER — Ambulatory Visit (HOSPITAL_COMMUNITY): Payer: Medicare Other | Attending: Internal Medicine

## 2022-06-08 ENCOUNTER — Ambulatory Visit (INDEPENDENT_AMBULATORY_CARE_PROVIDER_SITE_OTHER): Payer: Medicare Other | Admitting: Cardiology

## 2022-06-08 VITALS — BP 126/66 | HR 55 | Ht 72.0 in | Wt 170.0 lb

## 2022-06-08 DIAGNOSIS — I350 Nonrheumatic aortic (valve) stenosis: Secondary | ICD-10-CM | POA: Insufficient documentation

## 2022-06-08 DIAGNOSIS — I2510 Atherosclerotic heart disease of native coronary artery without angina pectoris: Secondary | ICD-10-CM

## 2022-06-08 DIAGNOSIS — I10 Essential (primary) hypertension: Secondary | ICD-10-CM | POA: Insufficient documentation

## 2022-06-08 DIAGNOSIS — Z9861 Coronary angioplasty status: Secondary | ICD-10-CM

## 2022-06-08 DIAGNOSIS — Z952 Presence of prosthetic heart valve: Secondary | ICD-10-CM

## 2022-06-08 LAB — ECHOCARDIOGRAM COMPLETE
AR max vel: 1.21 cm2
AV Area VTI: 1.42 cm2
AV Area mean vel: 1.32 cm2
AV Mean grad: 8 mmHg
AV Peak grad: 17.8 mmHg
Ao pk vel: 2.11 m/s
Area-P 1/2: 1.81 cm2
S' Lateral: 2.5 cm

## 2022-06-08 NOTE — Patient Instructions (Signed)
Medication Instructions:  Your physician recommends that you continue on your current medications as directed. Please refer to the Current Medication list given to you today.  *If you need a refill on your cardiac medications before your next appointment, please call your pharmacy*   Lab Work: NONE If you have labs (blood work) drawn today and your tests are completely normal, you will receive your results only by: [place] (if you have MyChart) OR A paper copy in the mail If you have any lab test that is abnormal or we need to change your treatment, we will call you to review the results.   Testing/Procedures: NONE   Follow-Up: At [facility], you and your health needs are our priority.  As part of our continuing mission to provide you with exceptional heart care, we have created designated Provider Care Teams.  These Care Teams include your primary Cardiologist (physician) and Advanced Practice Providers (APPs -  Physician Assistants and Nurse Practitioners) who all work together to provide you with the care you need, when you need it.  We recommend signing up for the patient portal called "MyChart".  Sign up information is provided on this After Visit Summary.  MyChart is used to connect with patients for Virtual Visits (Telemedicine).  Patients are able to view lab/test results, encounter notes, upcoming appointments, etc.  Non-urgent messages can be sent to your provider as well.   To learn more about what you can do with MyChart, go to [email or web address].    Your next appointment:   Sept 2024  Provider:   Minus Breeding, MD in [place]

## 2022-08-23 ENCOUNTER — Encounter: Payer: Self-pay | Admitting: Nurse Practitioner

## 2022-08-23 ENCOUNTER — Ambulatory Visit (INDEPENDENT_AMBULATORY_CARE_PROVIDER_SITE_OTHER): Payer: Medicare Other | Admitting: Nurse Practitioner

## 2022-08-23 VITALS — BP 106/58 | HR 76 | Temp 97.8°F | Ht 72.0 in | Wt 166.5 lb

## 2022-08-23 DIAGNOSIS — I10 Essential (primary) hypertension: Secondary | ICD-10-CM

## 2022-08-23 DIAGNOSIS — E039 Hypothyroidism, unspecified: Secondary | ICD-10-CM | POA: Diagnosis not present

## 2022-08-23 DIAGNOSIS — E782 Mixed hyperlipidemia: Secondary | ICD-10-CM | POA: Insufficient documentation

## 2022-08-23 DIAGNOSIS — H6123 Impacted cerumen, bilateral: Secondary | ICD-10-CM

## 2022-08-23 DIAGNOSIS — S12400A Unspecified displaced fracture of fifth cervical vertebra, initial encounter for closed fracture: Secondary | ICD-10-CM | POA: Insufficient documentation

## 2022-08-23 DIAGNOSIS — Z7689 Persons encountering health services in other specified circumstances: Secondary | ICD-10-CM | POA: Insufficient documentation

## 2022-08-23 DIAGNOSIS — I25810 Atherosclerosis of coronary artery bypass graft(s) without angina pectoris: Secondary | ICD-10-CM

## 2022-08-23 DIAGNOSIS — H6120 Impacted cerumen, unspecified ear: Secondary | ICD-10-CM

## 2022-08-23 NOTE — Progress Notes (Signed)
New Patient Office Visit  Subjective    Patient ID: Joseph Potts., male    DOB: Aug 11, 1932  Age: 89 y.o. MRN: [id number]  CC:  Chief Complaint  Patient presents with   Establish Care   Ear Fullness    Pt went to get hearing aids and was told he could not get them until he got his ears cleaned out.     HPI Joseph Potts 89 year old male with past medical history of hypertension, CAD With stent, Lyme disease, and hypothyroidism presents today to establish care, and new concerns of not to hear. Client reports that this morning he went for hearing test and was told that they cannot complete the exam due to wax buildup.  "Absorb the problem at least twice a year but did not think it was that bad".  Reported hard of hearing but did not think likely was due to wax.  We discussed that he do irrigation and he agrees.  He has history of lyme disease and reports dealing with back pain, with no radiate "Sharp/dull episodic pain; it can 10/10 at  times or a 2-3/10. It is a constant pain. Has a plate from C-2 to C-7 since 2013.  Sees neurology yearly, last visit was 2 months ago.  "I do not want any pain medication I believe an natural remedy".  Heart cath March 2023 with 1 stents under the care of Dr Berna Bue, last appointment was 6 months ago.  Denies chest pain, syncope, shortness of breath on exertion.  Currently on Plavix and daily aspirin.  He has a history of hyperlipidemia currently on pravastatin, and reports that it is well-controlled with his current dose of medication.  He reports that his thyroid is well-controlled with his current dose of Synthroid He had labs done twice yearly by Dr. Victorino Sparrow, had it done yesterday ad will share a copy of the results   BP was low today 100/58, as client reports that that is normal for him.  "I have always been athletic as such my heart rate and blood pressure are always be in the low low side.  Client is asymptomatic, denying lightheadedness  or fatigue  No recent labs on file ,client reports that he has been seeing Dr. Park Meo and get labs drawn twice a year.  He had labs 2 days ago, and refuses lab at the office.  He will sign all consent so we can get his labs from Dr. Lyla Son.   Outpatient Encounter Medications as of 08/23/2022  Medication Sig   Alpha-Lipoic Acid 300 MG CAPS Take 300 mg by mouth daily.   aspirin EC 81 MG tablet Take 1 tablet (81 mg total) by mouth daily. Swallow whole.   Cholecalciferol (VITAMIN D3 PO) Take 8,000 Units by mouth daily.   Chromium Picolinate (CHROMIUM PICOLATE PO) Take 200 mcg by mouth daily.   Magnesium 500 MG CAPS Take 1,000 mg by mouth daily.   OVER THE COUNTER MEDICATION Take 1 capsule by mouth daily. MSM Complex   pravastatin (PRAVACHOL) 40 MG tablet Take 1 tablet (40 mg total) by mouth every evening.   Probiotic Product (PROBIOTIC-10 PO) Take 1 capsule by mouth daily.   SELENIUM PO Take 3 capsules by mouth in the morning and at bedtime.   VITAMIN K PO Take 1 tablet by mouth daily.   zinc gluconate 50 MG tablet Take 50 mg by mouth daily.   BETA GLUCAN PO Take 1 capsule by mouth daily. (Patient not taking:  Reported on 08/23/2022)   clopidogrel (PLAVIX) 75 MG tablet Take 75 mg by mouth daily. (Patient not taking: Reported on 08/23/2022)   OVER THE COUNTER MEDICATION Take 1 capsule by mouth daily. Osteo Fix (Patient not taking: Reported on 08/23/2022)   OVER THE COUNTER MEDICATION Take 1 capsule by mouth daily. HDH Complex (Patient not taking: Reported on 08/23/2022)   UNABLE TO FIND Take 3 capsules by mouth in the morning and at bedtime. Essential Fatty Acids - 2 brands (Patient not taking: Reported on 08/23/2022)   No facility-administered encounter medications on file as of 08/23/2022.    Past Medical History:  Diagnosis Date   Bilateral carotid artery stenosis    per duplex  09-05-2012--  RICA 1-39%,  LICA 40-59%   CAD (coronary artery disease)    CAD (coronary artery disease)    Chronic  kidney disease    History of paroxysmal supraventricular tachycardia    History of pneumothorax    2008--  right side spontanenous, chest tube--  resolved   History of syncope    vasovagal response sitting of pneumothorax   Kidney stones    Right ureteral calculus    S/P TAVR (transcatheter aortic valve replacement) 05/30/2021   s/p TAVR with a 29 mm Edwards S3UR via the TF approach by Dr. Lynnette Caffey and Dr. Laneta Simmers   Severe aortic stenosis     Past Surgical History:  Procedure Laterality Date   ANAL FISSURE REPAIR  1985   ANTERIOR CERVICAL DECOMP/DISCECTOMY FUSION N/A 02/16/2014   Procedure: CERVICAL THREE TO FOUR, CERVICAL FOUR TO FIVE, CERVICAL FIVE TO SIX  ANTERIOR CERVICAL DECOMPRESSION/DISKECTOMY/FUSION;  Surgeon: Karn Cassis, MD;  Location: [facility];  Service: Neurosurgery;  Laterality: N/A;  C3-4 C4-5 C5-6 ANTERIOR CERVICAL DECOMPRESSION/DISKECTOMY/FUSION   COLONOSCOPY     FULLTHICKNESS SKIN LESION RESECTION RIGHT LONG/ RING WEB SPACE  03-15-2006   INTRAOPERATIVE TRANSTHORACIC ECHOCARDIOGRAM N/A 05/30/2021   Procedure: INTRAOPERATIVE TRANSTHORACIC ECHOCARDIOGRAM;  Surgeon: Orbie Pyo, MD;  Location: [facility] OR;  Service: Open Heart Surgery;  Laterality: N/A;   KNEE ARTHROSCOPY W/ MENISCECTOMY Left 05-18-2010   LUMBAR DISC SURGERY  1973   L4 -- L5   TENDON REPAIR  yrs ago   hand laceration   TRANSCATHETER AORTIC VALVE REPLACEMENT, TRANSFEMORAL Left 05/30/2021   Procedure: Transcatheter Aortic Valve Replacement, Transfemoral;  Surgeon: Orbie Pyo, MD;  Location: [facility];  Service: Open Heart Surgery;  Laterality: Left;   TRANSTHORACIC ECHOCARDIOGRAM  09-06-2011   mild LVH, grade 1 diastolic dysfunction, ef 55-65%/  mild AV stenosis and mild AR/  trivial MR ,PR and TR    Family History  Problem Relation Age of Onset   Arrhythmia Mother        Stroke   Arrhythmia Father        CNS bleed    Social History   Socioeconomic History   Marital status: Married     Spouse name: Not on file   Number of children: 5   Years of education: Not on file   Highest education level: Not on file  Occupational History   Occupation: Dentist  Tobacco Use   Smoking status: Never   Smokeless tobacco: Never  Vaping Use   Vaping Use: Never used  Substance and Sexual Activity   Alcohol use: No   Drug use: No   Sexual activity: Not on file  Other Topics Concern   Not on file  Social History Narrative   Not on file   Social Determinants  of Health   Financial Resource Strain: Not on file  Food Insecurity: Not on file  Transportation Needs: Not on file  Physical Activity: Not on file  Stress: Not on file  Social Connections: Not on file  Intimate Partner Violence: Not on file    ROS Negative unless indicated in HPI   Objective    BP (!) 106/58   Pulse 76   Temp 97.8 F (36.6 C) (Temporal)   Ht 6' (1.829 m)   Wt 166 lb 8 oz (75.5 kg)   SpO2 97%   BMI 22.58 kg/m   Physical Exam Vitals and nursing note reviewed.  Constitutional:      General: He is not in acute distress.    Appearance: Normal appearance. He is not ill-appearing.  HENT:     Head: Normocephalic and atraumatic.     Right Ear: There is impacted cerumen.     Left Ear: There is impacted cerumen.  Eyes:     Extraocular Movements: Extraocular movements intact.     Conjunctiva/sclera: Conjunctivae normal.     Pupils: Pupils are equal, round, and reactive to light.  Cardiovascular:     Rate and Rhythm: Normal rate.     Heart sounds: Normal heart sounds. No murmur heard. Pulmonary:     Effort: Pulmonary effort is normal.     Breath sounds: Normal breath sounds.  Abdominal:     General: Bowel sounds are normal.     Palpations: Abdomen is soft.  Musculoskeletal:        General: Normal range of motion.     Cervical back: Normal range of motion and neck supple.     Right lower leg: No edema.     Left lower leg: No edema.  Skin:    General: Skin is warm and dry.     Findings:  No rash.  Neurological:     General: No focal deficit present.     Mental Status: He is alert and oriented to person, place, and time. Mental status is at baseline.  Psychiatric:        Mood and Affect: Mood normal.        Behavior: Behavior normal.        Thought Content: Thought content normal.        Judgment: Judgment normal.       Assessment & Plan:  Essential hypertension -     CBC with Differential/Platelet; Future -     CMP14+EGFR; Future  Encounter to establish care -     CBC with Differential/Platelet; Future -     CMP14+EGFR; Future -     Lipid panel; Future -     TSH; Future  Wax in ear -     Ear wax removal  Acquired hypothyroidism -     TSH; Future  Coronary artery disease involving other coronary artery bypass graft, unspecified whether angina present -     Lipid panel; Future   Ear Cerumen Removal  Date/Time: 08/23/2022 7:28 PM  Performed by: Martina Sinner, NP Authorized by: Martina Sinner, NP   Anesthesia: Local Anesthetic: none Ceruminolytics applied: Ceruminolytics applied prior to the procedure. Location details: right ear and left ear Patient tolerance: patient tolerated the procedure well with no immediate complications Procedure type: curette  Sedation: Patient sedated: no    -Mixed hyperlipidemia  continue pravastatin 40 mg daily Hypothyroid ism continue current dose of symthroid  no medication refills needed today Future labs ordered  plan of  care discussed with client  follow-up as scheduled or sooner if needed  Return in about 6 months (around 02/23/2023), or if symptoms worsen or fail to improve, for follow-up.   [address] [place], [place]

## 2022-08-27 NOTE — Addendum Note (Signed)
Addended by: Evern Bio, Dois Davenport on: 08/27/2022 08:10 AM   Modules accepted: Level of Service
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
## 2022-12-05 ENCOUNTER — Ambulatory Visit (INDEPENDENT_AMBULATORY_CARE_PROVIDER_SITE_OTHER): Payer: Medicare Other | Admitting: Cardiology

## 2022-12-05 ENCOUNTER — Other Ambulatory Visit: Payer: Self-pay | Admitting: *Deleted

## 2022-12-05 ENCOUNTER — Encounter: Payer: Self-pay | Admitting: Cardiology

## 2022-12-05 VITALS — BP 130/62 | HR 61 | Ht 72.0 in | Wt 167.0 lb

## 2022-12-05 DIAGNOSIS — I10 Essential (primary) hypertension: Secondary | ICD-10-CM

## 2022-12-05 DIAGNOSIS — E782 Mixed hyperlipidemia: Secondary | ICD-10-CM

## 2022-12-05 DIAGNOSIS — Z79899 Other long term (current) drug therapy: Secondary | ICD-10-CM

## 2022-12-05 DIAGNOSIS — I2510 Atherosclerotic heart disease of native coronary artery without angina pectoris: Secondary | ICD-10-CM

## 2022-12-05 DIAGNOSIS — Z952 Presence of prosthetic heart valve: Secondary | ICD-10-CM | POA: Diagnosis not present

## 2022-12-05 NOTE — Patient Instructions (Addendum)
Medication Instructions:  Please restart Pravastatin daily. Discontinue your Plavix. Continue all other medications as listed.   *If you need a refill on your cardiac medications before your next appointment, please call your pharmacy*  Lab Work: Please have blood work in 4 months (Lipid panel due January 2025)  If you have labs (blood work) drawn today and your tests are completely normal, you will receive your results only by: MyChart Message (if you have MyChart) OR A paper copy in the mail If you have any lab test that is abnormal or we need to change your treatment, we will call you to review the results.  Follow-Up: At [facility], you and your health needs are our priority.  As part of our continuing mission to provide you with exceptional heart care, we have created designated Provider Care Teams.  These Care Teams include your primary Cardiologist (physician) and Advanced Practice Providers (APPs -  Physician Assistants and Nurse Practitioners) who all work together to provide you with the care you need, when you need it.  We recommend signing up for the patient portal called "MyChart".  Sign up information is provided on this After Visit Summary.  MyChart is used to connect with patients for Virtual Visits (Telemedicine).  Patients are able to view lab/test results, encounter notes, upcoming appointments, etc.  Non-urgent messages can be sent to your provider as well.   To learn more about what you can do with MyChart, go to [email or web address].    Your next appointment:   1 year(s)  Provider:   Rollene Rotunda, MD

## 2022-12-27 ENCOUNTER — Other Ambulatory Visit: Payer: Self-pay | Admitting: Cardiology

## 2023-01-02 DIAGNOSIS — H02834 Dermatochalasis of left upper eyelid: Secondary | ICD-10-CM | POA: Diagnosis not present

## 2023-01-02 DIAGNOSIS — H25811 Combined forms of age-related cataract, right eye: Secondary | ICD-10-CM | POA: Diagnosis not present

## 2023-01-02 DIAGNOSIS — H04123 Dry eye syndrome of bilateral lacrimal glands: Secondary | ICD-10-CM | POA: Diagnosis not present

## 2023-01-02 DIAGNOSIS — H43812 Vitreous degeneration, left eye: Secondary | ICD-10-CM | POA: Diagnosis not present

## 2023-01-02 DIAGNOSIS — H35363 Drusen (degenerative) of macula, bilateral: Secondary | ICD-10-CM | POA: Diagnosis not present

## 2023-01-02 DIAGNOSIS — Z961 Presence of intraocular lens: Secondary | ICD-10-CM | POA: Diagnosis not present

## 2023-01-02 DIAGNOSIS — H02831 Dermatochalasis of right upper eyelid: Secondary | ICD-10-CM | POA: Diagnosis not present

## 2023-01-16 DIAGNOSIS — M25551 Pain in right hip: Secondary | ICD-10-CM | POA: Diagnosis not present

## 2023-01-16 DIAGNOSIS — M25561 Pain in right knee: Secondary | ICD-10-CM | POA: Diagnosis not present

## 2023-03-18 DIAGNOSIS — D485 Neoplasm of uncertain behavior of skin: Secondary | ICD-10-CM | POA: Diagnosis not present

## 2023-03-18 DIAGNOSIS — L570 Actinic keratosis: Secondary | ICD-10-CM | POA: Diagnosis not present

## 2023-03-18 DIAGNOSIS — D225 Melanocytic nevi of trunk: Secondary | ICD-10-CM | POA: Diagnosis not present

## 2023-03-18 DIAGNOSIS — I788 Other diseases of capillaries: Secondary | ICD-10-CM | POA: Diagnosis not present

## 2023-03-18 DIAGNOSIS — L814 Other melanin hyperpigmentation: Secondary | ICD-10-CM | POA: Diagnosis not present

## 2023-03-18 DIAGNOSIS — L821 Other seborrheic keratosis: Secondary | ICD-10-CM | POA: Diagnosis not present

## 2023-04-01 ENCOUNTER — Telehealth: Payer: Self-pay | Admitting: Cardiology

## 2023-04-01 NOTE — Telephone Encounter (Signed)
 Patient calling to see if the dr still wants to see him mid January and if he needs labs done. Please advise

## 2023-04-11 LAB — LAB REPORT - SCANNED
A1c: 5.6
EGFR: 72

## 2023-04-12 NOTE — Telephone Encounter (Signed)
Called and spoke with patient. He states that he was seen by his PCP yesterday and blood work was done. He will ask them to forward the results to [place]. He states he will see Dr Antoine Poche this fall as previously discussed. He is on a recall list for that appointment.

## 2023-05-17 ENCOUNTER — Telehealth: Payer: Self-pay | Admitting: Cardiology

## 2023-05-17 NOTE — Telephone Encounter (Signed)
 Paper Work Dropped Off: Labs  Date:05/17/2023  Location of paper:  Dr. Antoine Poche box

## 2023-08-05 ENCOUNTER — Telehealth: Payer: Self-pay | Admitting: Cardiology

## 2023-08-05 NOTE — Telephone Encounter (Signed)
 Pt c/o BP issue: STAT if pt c/o blurred vision, one-sided weakness or slurred speech.  STAT if BP is GREATER than 180/120 TODAY.  STAT if BP is LESS than 90/60 and SYMPTOMATIC TODAY  1. What is your BP concern? BP has been elevated   2. Have you taken any BP medication today? No   3. What are your last 5 BP readings? 191/94 on Saturday 142/88 Right now   4. Are you having any other symptoms (ex. Dizziness, headache, blurred vision, passed out)? Dizziness and fatigue

## 2023-08-05 NOTE — Telephone Encounter (Signed)
 Spoke with pt regarding his blood pressures. Pt's blood pressure today was 144/78 and later was 142/88. Pt stated his pulse has been between 58-60. On Saturday 5/10 the pt reported his blood pressure as 191/94. Pt stated he has symptoms of dizziness and fatigue but only with activity such as walking and gardening. Pt also stated he would like to avoid taking medication for his blood pressure. Pt was told the information provided would be sent to Dr. Lavonne Prairie for suggestions. Pt verbalized understanding. All questions if any were answered.

## 2023-08-07 MED ORDER — AMLODIPINE BESYLATE 5 MG PO TABS: 2.5000 mg | ORAL_TABLET | Freq: Every day | ORAL | 3 refills | Status: AC

## 2023-08-07 NOTE — Telephone Encounter (Signed)
 Spoke with pt regarding Dr. Atlas Lea suggestion to start Norvasc 2.5 mg once daily. Pt agreeable. Prescription ordered and sent to pt's pharmacy of choice. Pt verbalized understanding. All questions if any were answered.

## 2023-08-20 ENCOUNTER — Other Ambulatory Visit: Payer: Self-pay | Admitting: Nurse Practitioner

## 2023-08-20 DIAGNOSIS — I10 Essential (primary) hypertension: Secondary | ICD-10-CM

## 2023-08-20 DIAGNOSIS — Z7689 Persons encountering health services in other specified circumstances: Secondary | ICD-10-CM

## 2023-08-20 DIAGNOSIS — E039 Hypothyroidism, unspecified: Secondary | ICD-10-CM

## 2023-10-21 ENCOUNTER — Other Ambulatory Visit: Payer: Self-pay | Admitting: Cardiology

## 2024-01-27 ENCOUNTER — Other Ambulatory Visit: Payer: Self-pay | Admitting: Nurse Practitioner

## 2024-01-27 DIAGNOSIS — R251 Tremor, unspecified: Secondary | ICD-10-CM | POA: Insufficient documentation

## 2024-01-27 NOTE — Progress Notes (Signed)
 Joseph Potts is an 89 year old Caucasian male, reach out to the provider complaining of right hand tremor since 2012.  He had back surgery and reports that postsurgery he developed tremor since 20 2012 and have trouble signing his name. He is requesting a referral to go see Dr. Nancye f
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
## 2024-02-07 ENCOUNTER — Encounter: Payer: Self-pay | Admitting: Cardiology

## 2024-02-07 ENCOUNTER — Ambulatory Visit: Attending: Cardiology | Admitting: Cardiology

## 2024-02-07 VITALS — BP 140/70 | HR 57 | Ht 72.0 in | Wt 170.0 lb

## 2024-02-07 DIAGNOSIS — K869 Disease of pancreas, unspecified: Secondary | ICD-10-CM | POA: Insufficient documentation

## 2024-02-07 DIAGNOSIS — I10 Essential (primary) hypertension: Secondary | ICD-10-CM | POA: Diagnosis present

## 2024-02-07 DIAGNOSIS — R0989 Other specified symptoms and signs involving the circulatory and respiratory systems: Secondary | ICD-10-CM | POA: Insufficient documentation

## 2024-02-07 DIAGNOSIS — I2510 Atherosclerotic heart disease of native coronary artery without angina pectoris: Secondary | ICD-10-CM | POA: Diagnosis not present

## 2024-02-07 DIAGNOSIS — Z01812 Encounter for preprocedural laboratory examination: Secondary | ICD-10-CM | POA: Diagnosis present

## 2024-02-07 DIAGNOSIS — E785 Hyperlipidemia, unspecified: Secondary | ICD-10-CM | POA: Diagnosis not present

## 2024-02-07 DIAGNOSIS — Z952 Presence of prosthetic heart valve: Secondary | ICD-10-CM | POA: Insufficient documentation

## 2024-02-07 NOTE — Patient Instructions (Addendum)
 Medication Instructions:  Your physician recommends that you continue on your current medications as directed. Please refer to the Current Medication list given to you today.  *If you need a refill on your cardiac medications before your next appointment, please call your pharmacy*  Lab Work: CBC with in 7 days of scheduled MRI If you have labs (blood work) drawn today and your tests are completely normal, you will receive your results only by: MyChart Message (if you have MyChart) OR A paper copy in the mail If you have any lab test that is abnormal or we need to change your treatment, we will call you to review the results.  Testing/Procedures: Pre and post contrast MRI/MRCP  Carotid dopplers  Follow-Up: At [facility], you and your health needs are our priority.  As part of our continuing mission to provide you with exceptional heart care, our providers are all part of one team.  This team includes your primary Cardiologist (physician) and Advanced Practice Providers or APPs (Physician Assistants and Nurse Practitioners) who all work together to provide you with the care you need, when you need it.  Your next appointment:   1 year(s)  Provider:   Lynwood Schilling, MD    We recommend signing up for the patient portal called MyChart.  Sign up information is provided on this After Visit Summary.  MyChart is used to connect with patients for Virtual Visits (Telemedicine).  Patients are able to view lab/test results, encounter notes, upcoming appointments, etc.  Non-urgent messages can be sent to your provider as well.   To learn more about what you can do with MyChart, go to [email or web address].   Blood Pressure Diary: Take your blood pressure 3 times daily for 10 days and send us  the readings via MyChart.   CRISPR gene editing

## 2024-02-17 ENCOUNTER — Ambulatory Visit: Payer: Self-pay | Admitting: Cardiology

## 2024-02-17 ENCOUNTER — Ambulatory Visit (HOSPITAL_COMMUNITY)
Admission: RE | Admit: 2024-02-17 | Discharge: 2024-02-17 | Disposition: A | Discharge status: Home / Self Care | Source: Ambulatory Visit | Attending: Cardiology | Admitting: Cardiology

## 2024-02-17 DIAGNOSIS — R0989 Other specified symptoms and signs involving the circulatory and respiratory systems: Secondary | ICD-10-CM | POA: Insufficient documentation

## 2024-02-17 DIAGNOSIS — I350 Nonrheumatic aortic (valve) stenosis: Secondary | ICD-10-CM

## 2024-02-18 NOTE — Telephone Encounter (Signed)
-----   Message from Lynwood Schilling sent at 02/17/2024 10:00 AM EST ----- Please call the patient with results.  He has significant stenosis of the right carotid.  Please refer to [facility].  Call Mr. Miske with the results and send results to Orlie Norris, MD ----- Message ----- From: Interface, [employer] One Seven Sent: 02/17/2024   8:51 AM EST To: Lynwood Schilling, MD

## 2024-02-18 NOTE — Telephone Encounter (Signed)
 Spoke with pt regarding results. Referral ordered. Pt aware of referral. Pt verbalized understanding. All questions if any were answered. Results sent to PCP.

## 2024-02-19 ENCOUNTER — Other Ambulatory Visit: Payer: Self-pay | Admitting: Cardiology

## 2024-02-19 ENCOUNTER — Ambulatory Visit (HOSPITAL_COMMUNITY)
Admission: RE | Admit: 2024-02-19 | Discharge: 2024-02-19 | Disposition: A | Discharge status: Home / Self Care | Source: Ambulatory Visit | Attending: Cardiology | Admitting: Cardiology

## 2024-02-19 DIAGNOSIS — K869 Disease of pancreas, unspecified: Secondary | ICD-10-CM | POA: Insufficient documentation

## 2024-02-19 MED ORDER — GADOBUTROL 1 MMOL/ML IV SOLN
7.0000 mL | Freq: Once | INTRAVENOUS | Status: AC | PRN
  Administered 2024-02-19: 7 mL via INTRAVENOUS

## 2024-03-09 ENCOUNTER — Other Ambulatory Visit: Payer: Self-pay

## 2024-03-09 DIAGNOSIS — I6523 Occlusion and stenosis of bilateral carotid arteries: Secondary | ICD-10-CM

## 2024-03-10 ENCOUNTER — Ambulatory Visit (HOSPITAL_COMMUNITY): Admission: RE | Admit: 2024-03-10 | Discharge: 2024-03-10 | Attending: Vascular Surgery

## 2024-03-10 DIAGNOSIS — I6523 Occlusion and stenosis of bilateral carotid arteries: Secondary | ICD-10-CM | POA: Insufficient documentation

## 2024-03-10 LAB — POCT I-STAT CREATININE: Creatinine, Ser: 1.4 mg/dL — ABNORMAL HIGH (ref 0.61–1.24)

## 2024-03-10 MED ORDER — IOHEXOL 350 MG/ML SOLN
75.0000 mL | Freq: Once | INTRAVENOUS | Status: AC | PRN
  Administered 2024-03-10: 17:00:00 75 mL via INTRAVENOUS

## 2024-03-12 NOTE — Progress Notes (Unsigned)
 Patient ID: Joseph VEAR Ezzard Mickey., male   DOB: 1932-09-05, 89 y.o.   MRN: [id number]  Reason for Consult: No chief complaint on file.   Referred by Orlie Norris, MD  Subjective:     HPI Joseph VEAR Ezzard Mickey. is a 89 y.o. male presenting for evaluation of carotid artery stenosis.  He has history of aortic stenosis and underwent TAVR in March 2023.  He has been doing well from a cardiac standpoint and recently saw his cardiologist, Dr. Lavona in November 2025 who obtained a carotid duplex when he noted a bruit. He denies any recent strokes or strokelike symptoms.  Specifically denies any one-sided weakness, numbness, amaurosis or speech issues.  He has had cervical neck surgery with an ACDF.  Past Medical History:  Diagnosis Date   Bilateral carotid artery stenosis    per duplex  09-05-2012--  RICA 1-39%,  LICA 40-59%   CAD (coronary artery disease)    CAD (coronary artery disease)    Chronic kidney disease    History of paroxysmal supraventricular tachycardia    History of pneumothorax    2008--  right side spontanenous, chest tube--  resolved   History of syncope    vasovagal response sitting of pneumothorax   Kidney stones    Right ureteral calculus    S/P TAVR (transcatheter aortic valve replacement) 05/30/2021   s/p TAVR with a 29 mm Edwards S3UR via the TF approach by Dr. Wendel and Dr. Lucas   Severe aortic stenosis    Family History  Problem Relation Age of Onset   Arrhythmia Mother        Stroke   Arrhythmia Father        CNS bleed   Past Surgical History:  Procedure Laterality Date   ANAL FISSURE REPAIR  1985   ANTERIOR CERVICAL DECOMP/DISCECTOMY FUSION N/A 02/16/2014   Procedure: CERVICAL THREE TO FOUR, CERVICAL FOUR TO FIVE, CERVICAL FIVE TO SIX  ANTERIOR CERVICAL DECOMPRESSION/DISKECTOMY/FUSION;  Surgeon: Catalina CHRISTELLA Stains, MD;  Location: [facility];  Service: Neurosurgery;  Laterality: N/A;  C3-4 C4-5 C5-6 ANTERIOR CERVICAL  DECOMPRESSION/DISKECTOMY/FUSION   COLONOSCOPY     FULLTHICKNESS SKIN LESION RESECTION RIGHT LONG/ RING WEB SPACE  03-15-2006   INTRAOPERATIVE TRANSTHORACIC ECHOCARDIOGRAM N/A 05/30/2021   Procedure: INTRAOPERATIVE TRANSTHORACIC ECHOCARDIOGRAM;  Surgeon: Wendel Lurena POUR, MD;  Location: [facility];  Service: Open Heart Surgery;  Laterality: N/A;   KNEE ARTHROSCOPY W/ MENISCECTOMY Left 05-18-2010   LUMBAR DISC SURGERY  1973   L4 -- L5   TENDON REPAIR  yrs ago   hand laceration   TRANSCATHETER AORTIC VALVE REPLACEMENT, TRANSFEMORAL Left 05/30/2021   Procedure: Transcatheter Aortic Valve Replacement, Transfemoral;  Surgeon: Wendel Lurena POUR, MD;  Location: [facility];  Service: Open Heart Surgery;  Laterality: Left;   TRANSTHORACIC ECHOCARDIOGRAM  09-06-2011   mild LVH, grade 1 diastolic dysfunction, ef 55-65%/  mild AV stenosis and mild AR/  trivial MR ,PR and TR    Short Social History:  Social History   Tobacco Use   Smoking status: Never   Smokeless tobacco: Never  Substance Use Topics   Alcohol use: No    Allergies[1]  Current Outpatient Medications  Medication Sig Dispense Refill   Acetylcysteine (NAC PO) Take by mouth daily. (Patient not taking: Reported on 02/07/2024)     amLODipine  (NORVASC ) 5 MG tablet Take 0.5 tablets (2.5 mg total) by mouth daily. (Patient taking differently: Take 2.5 mg by mouth daily. Takes 5 mg) 90 tablet 3  aspirin  EC 81 MG tablet Take 1 tablet (81 mg total) by mouth daily. Swallow whole. 1 tablet 0   Boswellia Serrata (BOSWELLIA PO) Take by mouth daily. (Patient not taking: Reported on 02/07/2024)     Cholecalciferol (VITAMIN D3 PO) Take 8,000 Units by mouth daily.     Chromium (CHROMEMATE PO) Take by mouth daily.     MAGNESIUM  CITRATE PO Take by mouth daily. (Patient not taking: Reported on 02/07/2024)     MAGNESIUM  GLYCINATE PO Take 2 mg by mouth.     pravastatin  (PRAVACHOL ) 40 MG tablet TAKE 1 TABLET BY MOUTH EVERY EVENING 30 tablet 0   Probiotic Product  (PROBIOTIC-10 PO) Take 1 capsule by mouth daily.     UNABLE TO FIND Take 3 capsules by mouth in the morning and at bedtime. Essential Fatty Acids - 2 brands     VITAMIN K PO Take 1 tablet by mouth daily.     zinc gluconate 50 MG tablet Take 50 mg by mouth daily.     No current facility-administered medications for this visit.    REVIEW OF SYSTEMS  All other systems were reviewed and are negative     Objective:  Objective   There were no vitals filed for this visit. There is no height or weight on file to calculate BMI.  Physical Exam General: no acute distress Cardiac: hemodynamically stable Abdomen: non-tender, no pulsatile mass*** Extremities: no edema, cyanosis or wounds*** Vascular:   Right: ***  Left: ***  Data: Carotid duplex Right Carotid Findings:  +----------+--------+--------+--------+------------------+--------+           PSV cm/sEDV cm/sStenosisPlaque DescriptionComments  +----------+--------+--------+--------+------------------+--------+  CCA Prox  53      6                                           +----------+--------+--------+--------+------------------+--------+  CCA Mid   52      7                                           +----------+--------+--------+--------+------------------+--------+  CCA Distal35      3               homogeneous                 +----------+--------+--------+--------+------------------+--------+  ICA Prox  654     180     80-99%  heterogenous                +----------+--------+--------+--------+------------------+--------+  ICA Mid   93      26                                          +----------+--------+--------+--------+------------------+--------+  ICA Distal44      16                                          +----------+--------+--------+--------+------------------+--------+  ECA      160     6               heterogenous                 +----------+--------+--------+--------+------------------+--------+   +----------+--------+-------+----------------+-------------------+  PSV cm/sEDV cmsDescribe        Arm Pressure (mmHG)  +----------+--------+-------+----------------+-------------------+  [id number]     2      Multiphasic, [id number]                  +----------+--------+-------+----------------+-------------------+   +---------+--------+--+--------+--+---------+  VertebralPSV cm/s36EDV cm/s10Antegrade  +---------+--------+--+--------+--+---------+      Left Carotid Findings:  +----------+--------+--------+--------+------------------+--------+           PSV cm/sEDV cm/sStenosisPlaque DescriptionComments  +----------+--------+--------+--------+------------------+--------+  CCA Prox  85      15              heterogenous                +----------+--------+--------+--------+------------------+--------+  CCA Mid   115     14              heterogenous                +----------+--------+--------+--------+------------------+--------+  CCA Distal121     11              heterogenous                +----------+--------+--------+--------+------------------+--------+  ICA Prox  123     28      1-39%   heterogenous                +----------+--------+--------+--------+------------------+--------+  ICA Mid   127     19                                          +----------+--------+--------+--------+------------------+--------+  ICA Distal87      16                                          +----------+--------+--------+--------+------------------+--------+  ECA      101     1               heterogenous                +----------+--------+--------+--------+------------------+--------+   +----------+--------+--------+----------------+-------------------+           PSV cm/sEDV cm/sDescribe        Arm Pressure (mmHG)   +----------+--------+--------+----------------+-------------------+  [id number]    1       Multiphasic, [id number]                  +----------+--------+--------+----------------+-------------------+   +---------+--------+--+--------+--+---------+  VertebralPSV cm/s71EDV cm/s16Antegrade  +---------+--------+--+--------+--+---------+   CTA Severe string sign stenosis of the right proximal ICA     Assessment/Plan:   Joseph VEAR Ezzard Mickey. is a 89 y.o. male with asymptomatic severe greater than 90% stenosis of the right ICA. We discussed the 3 modalities of treatment with best medical therapy, carotid endarterectomy and carotid stenting. We discussed the risks and benefits of carotid intrevention.  I informed that there is a small risk of stroke during the procedure which is about 1 to 2% but with medical therapy alone there is a 12% risk of ipsilateral stroke over the next 2 years.  I explained that with intervention, that risk is significantly reduced.  We also discussed the risk of cranial nerve injury, bleeding, infection and the small risk of restenosis in the future. I offered TCAR due to his age and previous history of  neck surgery.  ***   Recommendations to optimize cardiovascular risk: Abstinence from all tobacco products. Blood glucose control with goal A1c < 7%. Blood pressure control with goal blood pressure < 140/90 mmHg. Lipid reduction therapy with goal LDL-C <55 mg/dL  Aspirin  81mg  PO QD.  Atorvastatin 40-80mg  PO QD (or other high intensity statin therapy).   Norman GORMAN Serve MD [facility]     [1]  Allergies Allergen Reactions   Ibuprofen Swelling   Ginger Swelling

## 2024-03-13 ENCOUNTER — Other Ambulatory Visit: Payer: Self-pay

## 2024-03-13 ENCOUNTER — Encounter: Payer: Self-pay | Admitting: Vascular Surgery

## 2024-03-13 ENCOUNTER — Ambulatory Visit: Attending: Vascular Surgery | Admitting: Vascular Surgery

## 2024-03-13 VITALS — BP 122/61 | HR 67 | Temp 98.5°F | Resp 18 | Ht 72.0 in | Wt 176.8 lb

## 2024-03-13 DIAGNOSIS — I6521 Occlusion and stenosis of right carotid artery: Secondary | ICD-10-CM | POA: Insufficient documentation

## 2024-03-13 DIAGNOSIS — I6523 Occlusion and stenosis of bilateral carotid arteries: Secondary | ICD-10-CM

## 2024-03-13 MED ORDER — CLOPIDOGREL BISULFATE 75 MG PO TABS: 75.0000 mg | ORAL_TABLET | Freq: Every day | ORAL | 0 refills | Status: DC

## 2024-04-13 ENCOUNTER — Other Ambulatory Visit: Payer: Self-pay

## 2024-04-14 ENCOUNTER — Encounter (HOSPITAL_COMMUNITY): Payer: Self-pay

## 2024-04-14 NOTE — Progress Notes (Signed)
 Patient questioned about medication that he was scheduled to take prior to surgery.  Per Alan Glance at Dr. Drexel office, medication was called into the pharmacy.  Patient was made aware.

## 2024-04-14 NOTE — Pre-Procedure Instructions (Signed)
 Surgical Instructions   Your procedure is scheduled on April 23, 2024. Report to [facility] Main Entrance A at 5:30 A.M., then check in with the Admitting office. Any questions or running late day of surgery: call [phone]  Questions prior to your surgery date: call [phone], Monday-Friday, 8am-4pm. If you experience any cold or flu symptoms such as cough, fever, chills, shortness of breath, etc. between now and your scheduled surgery, please notify us  at the above number.     Remember:  Do not eat or drink after midnight the night before your surgery    Take these medicines the morning of surgery with A SIP OF WATER: amLODipine  (NORVASC )  aspirin  EC  thyroid  (NIVA THYROID )    One week prior to surgery, STOP taking any Aleve, Naproxen, Ibuprofen, Motrin, Advil, Goody's, BC's, all herbal medications, OMEGA-3 FATTY ACIDS, and non-prescription vitamins.                     Do NOT Smoke (Tobacco/Vaping) for 24 hours prior to your procedure.  If you use a CPAP at night, you may bring your mask/headgear for your overnight stay.   You will be asked to remove any contacts, glasses, piercing's, hearing aid's, dentures/partials prior to surgery. Please bring cases for these items if needed.    Your surgeon will determine if you are to be admitted or discharged the same day.  Patients discharged the day of surgery will not be allowed to drive home, and someone needs to stay with them for 24 hours.  SURGICAL WAITING ROOM VISITATION Patients may have no more than 2 support people in the waiting area - these visitors may rotate.   Pre-op nurse will coordinate an appropriate time for 2 ADULT support persons, who may not rotate, to accompany patient in pre-op.  Children under the age of 16 must have an adult with them who is not the patient and must remain in the main waiting area with an adult.  If the patient needs to stay at the hospital during part of their recovery, the visitor  guidelines for inpatient rooms apply.  Please refer to the [facility] website for the visitor guidelines for any additional information.   If you received a COVID test during your pre-op visit  it is requested that you wear a mask when out in public, stay away from anyone that may not be feeling well and notify your surgeon if you develop symptoms. If you have been in contact with anyone that has tested positive in the last 10 days please notify you surgeon.      Pre-operative CHG Bathing Instructions   You can play a key role in reducing the risk of infection after surgery. Your skin needs to be as free of germs as possible. You can reduce the number of germs on your skin by washing with CHG (chlorhexidine  gluconate) soap before surgery. CHG is an antiseptic soap that kills germs and continues to kill germs even after washing.   DO NOT use if you have an allergy to chlorhexidine /CHG or antibacterial soaps. If your skin becomes reddened or irritated, stop using the CHG and notify one of our RNs at [phone].              TAKE A SHOWER THE NIGHT BEFORE SURGERY   Please keep in mind the following:  DO NOT shave, including legs and underarms, 48 hours prior to surgery.   You may shave your face before/day of surgery.  Place clean sheets on your bed the night before surgery Use a clean washcloth (not used since being washed) for shower. DO NOT sleep with pet's night before surgery.  CHG Shower Instructions:  Wash your face and private area with normal soap. If you choose to wash your hair, wash first with your normal shampoo.  After you use shampoo/soap, rinse your hair and body thoroughly to remove shampoo/soap residue.  Turn the water OFF and apply half the bottle of CHG soap to a CLEAN washcloth.  Apply CHG soap ONLY FROM YOUR NECK DOWN TO YOUR TOES (washing for 3-5 minutes)  DO NOT use CHG soap on face, private areas, open wounds, or sores.  Pay special attention to the area where  your surgery is being performed.  If you are having back surgery, having someone wash your back for you may be helpful. Wait 2 minutes after CHG soap is applied, then you may rinse off the CHG soap.  Pat dry with a clean towel  Put on clean pajamas    Additional instructions for the day of surgery: If you choose, you may shower the morning of surgery with an antibacterial soap.  DO NOT APPLY any lotions, deodorants, cologne, or perfumes.   Do not wear jewelry or makeup Do not wear nail polish, gel polish, artificial nails, or any other type of covering on natural nails (fingers and toes) Do not bring valuables to the hospital. [facility] is not responsible for valuables/personal belongings. Put on clean/comfortable clothes.  Please brush your teeth.  Ask your nurse before applying any prescription medications to the skin.

## 2024-04-15 ENCOUNTER — Other Ambulatory Visit: Payer: Self-pay

## 2024-04-15 ENCOUNTER — Encounter (HOSPITAL_COMMUNITY): Payer: Self-pay

## 2024-04-15 ENCOUNTER — Encounter (HOSPITAL_COMMUNITY)
Admission: RE | Admit: 2024-04-15 | Discharge: 2024-04-15 | Disposition: A | Discharge status: Home / Self Care | Source: Ambulatory Visit | Attending: Vascular Surgery | Admitting: Vascular Surgery

## 2024-04-15 VITALS — BP 138/75 | HR 64 | Temp 98.0°F | Resp 18 | Ht 72.0 in | Wt 176.5 lb

## 2024-04-15 DIAGNOSIS — Z01812 Encounter for preprocedural laboratory examination: Secondary | ICD-10-CM | POA: Diagnosis not present

## 2024-04-15 DIAGNOSIS — Z955 Presence of coronary angioplasty implant and graft: Secondary | ICD-10-CM | POA: Insufficient documentation

## 2024-04-15 DIAGNOSIS — I6523 Occlusion and stenosis of bilateral carotid arteries: Secondary | ICD-10-CM | POA: Diagnosis not present

## 2024-04-15 DIAGNOSIS — I2510 Atherosclerotic heart disease of native coronary artery without angina pectoris: Secondary | ICD-10-CM | POA: Diagnosis not present

## 2024-04-15 DIAGNOSIS — Z01818 Encounter for other preprocedural examination: Secondary | ICD-10-CM

## 2024-04-15 DIAGNOSIS — Z952 Presence of prosthetic heart valve: Secondary | ICD-10-CM | POA: Diagnosis not present

## 2024-04-15 HISTORY — DX: Peripheral vascular disease, unspecified: I73.9

## 2024-04-15 HISTORY — DX: Hypothyroidism, unspecified: E03.9

## 2024-04-15 HISTORY — DX: Rheumatoid arthritis, unspecified: M06.9

## 2024-04-15 LAB — PROTIME-INR
INR: 1 (ref 0.8–1.2)
Prothrombin Time: 13.7 s (ref 11.4–15.2)

## 2024-04-15 LAB — CBC
HCT: 43.2 % (ref 39.0–52.0)
Hemoglobin: 14.4 g/dL (ref 13.0–17.0)
MCH: 33.3 pg (ref 26.0–34.0)
MCHC: 33.3 g/dL (ref 30.0–36.0)
MCV: 100 fL (ref 80.0–100.0)
Platelets: 203 K/uL (ref 150–400)
RBC: 4.32 MIL/uL (ref 4.22–5.81)
RDW: 12.3 % (ref 11.5–15.5)
WBC: 8.3 K/uL (ref 4.0–10.5)
nRBC: 0 % (ref 0.0–0.2)

## 2024-04-15 LAB — URINALYSIS, ROUTINE W REFLEX MICROSCOPIC
Bilirubin Urine: NEGATIVE
Glucose, UA: NEGATIVE mg/dL
Hgb urine dipstick: NEGATIVE
Ketones, ur: NEGATIVE mg/dL
Leukocytes,Ua: NEGATIVE
Nitrite: NEGATIVE
Protein, ur: NEGATIVE mg/dL
Specific Gravity, Urine: 1.009 (ref 1.005–1.030)
pH: 6 (ref 5.0–8.0)

## 2024-04-15 LAB — COMPREHENSIVE METABOLIC PANEL WITH GFR
ALT: 16 U/L (ref 0–44)
AST: 24 U/L (ref 15–41)
Albumin: 4.7 g/dL (ref 3.5–5.0)
Alkaline Phosphatase: 101 U/L (ref 38–126)
Anion gap: 7 (ref 5–15)
BUN: 21 mg/dL (ref 8–23)
CO2: 31 mmol/L (ref 22–32)
Calcium: 10.1 mg/dL (ref 8.9–10.3)
Chloride: 100 mmol/L (ref 98–111)
Creatinine, Ser: 1.15 mg/dL (ref 0.61–1.24)
GFR, Estimated: >60 mL/min
Glucose, Bld: 99 mg/dL (ref 70–99)
Potassium: 4.4 mmol/L (ref 3.5–5.1)
Sodium: 138 mmol/L (ref 135–145)
Total Bilirubin: 0.4 mg/dL (ref 0.0–1.2)
Total Protein: 7.9 g/dL (ref 6.5–8.1)

## 2024-04-15 LAB — SURGICAL PCR SCREEN
MRSA, PCR: NEGATIVE
Staphylococcus aureus: NEGATIVE

## 2024-04-15 LAB — APTT: aPTT: 33 s (ref 24–36)

## 2024-04-15 LAB — TYPE AND SCREEN
ABO/RH(D): A NEG
Antibody Screen: NEGATIVE

## 2024-04-15 NOTE — Progress Notes (Signed)
 PCP - Dr. Almarie Dollar Cardiologist - Dr. Lynwood Schilling  PPM/ICD - denies   Chest x-ray - 05/26/21 EKG - 02/07/24 Stress Test - 2004 ECHO - 06/08/22 Cardiac Cath - 05/30/21  Sleep Study - denies   DM- denies  Last dose of GLP1 agonist-  n/a   Blood Thinner Instructions: n/a Aspirin  Instructions: continue  ERAS Protcol - no, NPO   COVID TEST- n/a   Anesthesia review: yes, cardiac hx  Patient denies shortness of breath, fever, cough and chest pain at PAT appointment   All instructions explained to the patient, with a verbal understanding of the material. Patient agrees to go over the instructions while at home for a better understanding. The opportunity to ask questions was provided.

## 2024-04-16 NOTE — Anesthesia Preprocedure Evaluation (Addendum)
 "                                  Anesthesia Evaluation  Patient identified by MRN, date of birth, ID band Patient awake    Reviewed: Allergy & Precautions, NPO status , Patient's Chart, lab work & pertinent test results  History of Anesthesia Complications Negative for: history of anesthetic complications  Airway Mallampati: I  TM Distance: >3 FB Neck ROM: Limited    Dental  (+) Teeth Intact, Dental Advisory Given   Pulmonary   Hx of Spontaneous Pneumothorax (2008)    breath sounds clear to auscultation       Cardiovascular hypertension, Pt. on medications + CAD, + Past MI, + Cardiac Stents and + Peripheral Vascular Disease (Bilateral Carotid Stenosis)  + dysrhythmias Supra Ventricular Tachycardia + Valvular Problems/Murmurs (s/p TAVR (2023))  Rhythm:Regular Rate:Normal  EKG 02/07/2024: Sinus bradycardia with first-degree AV block.  Rate 57.   TTE 06/08/2022: 1. Left ventricular ejection fraction, by estimation, is 60 to 65%. The  left ventricle has normal function. The left ventricle has no regional  wall motion abnormalities. There is moderate left ventricular hypertrophy.  Left ventricular diastolic  parameters are consistent with Grade I diastolic dysfunction (impaired  relaxation).   2. Right ventricular systolic function is normal. The right ventricular  size is normal. There is normal pulmonary artery systolic pressure.   3. Left atrial size was mildly dilated.   4. Trivial mitral valve regurgitation. Moderate to severe mitral annular  calcification.   5. S/p TAVR 29 mm Sapien 3. Normal prosthesis. Aortic valve regurgitation  is not visualized. Aortic valve area, by VTI measures 1.42 cm. Aortic  valve mean gradient measures 8.0 mmHg. Aortic valve Vmax measures 2.11  m/s.   6. The inferior vena cava is normal in size with greater than 50%  respiratory variability, suggesting right atrial pressure of 3 mmHg.     Neuro/Psych    GI/Hepatic    Endo/Other  Hypothyroidism    Renal/GU Renal InsufficiencyRenal disease     Musculoskeletal  (+) Arthritis , Rheumatoid disorders,   Hx of ACDF (2015)    Abdominal   Peds  Hematology  Hgb 14.4, Plts 203K (04/15/24)    Anesthesia Other Findings   Reproductive/Obstetrics                              Anesthesia Physical Anesthesia Plan  ASA: 3  Anesthesia Plan: General   Post-op Pain Management:    Induction: Intravenous  PONV Risk Score and Plan: 2 and Ondansetron , Dexamethasone  and Treatment may vary due to age or medical condition  Airway Management Planned: Oral ETT and Video Laryngoscope Planned  Additional Equipment: Arterial line  Intra-op Plan:   Post-operative Plan: Extubation in OR  Informed Consent: I have reviewed the patients History and Physical, chart, labs and discussed the procedure including the risks, benefits and alternatives for the proposed anesthesia with the patient or authorized representative who has indicated his/her understanding and acceptance.     Dental advisory given  Plan Discussed with: CRNA  Anesthesia Plan Comments: (PAT note by Lynwood Hope, PA-C: 89 year old male follows with cardiology for history of syncope, CAD s/p PCI of RCA with shockwave 04/2021, severe AS s/p TAVR 05/2021.  Most recent echo 05/2022 showed LVEF 60 to 65%, grade 1 DD, normal RV systolic function, moderate to severe  MAC with trivial mitral regurgitation, normally functioning bioprosthetic aortic valve with mean gradient 8 mmHg.  Last seen by Dr. Lavona 02/07/2024, bruit noted at that time, carotid ultrasound ordered.  Duplex 02/17/2024 showed 80 to 99% R ICA stenosis and 1 to 39% LICA stenosis.  Dr. Lavona referred the patient to vascular surgery.  He was seen by Dr. Pearline 03/13/2024 who offered TCAR for stroke prevention.  Other pertinent history includes C3-6 ACDF.  Preop labs reviewed, WNL.  EKG 02/07/2024: Sinus  bradycardia with first-degree AV block.  Rate 57.  TTE 06/08/2022: 1. Left ventricular ejection fraction, by estimation, is 60 to 65%. The  left ventricle has normal function. The left ventricle has no regional  wall motion abnormalities. There is moderate left ventricular hypertrophy.  Left ventricular diastolic  parameters are consistent with Grade I diastolic dysfunction (impaired  relaxation).  2. Right ventricular systolic function is normal. The right ventricular  size is normal. There is normal pulmonary artery systolic pressure.  3. Left atrial size was mildly dilated.  4. Trivial mitral valve regurgitation. Moderate to severe mitral annular  calcification.  5. S/p TAVR 29 mm Sapien 3. Normal prosthesis. Aortic valve regurgitation  is not visualized. Aortic valve area, by VTI measures 1.42 cm. Aortic  valve mean gradient measures 8.0 mmHg. Aortic valve Vmax measures 2.11  m/s.  6. The inferior vena cava is normal in size with greater than 50%  respiratory variability, suggesting right atrial pressure of 3 mmHg.     )         Anesthesia Quick Evaluation  "

## 2024-04-16 NOTE — Progress Notes (Signed)
 Anesthesia Chart Review:  89 year old male follows with cardiology for history of syncope, CAD s/p PCI of RCA with shockwave 04/2021, severe AS s/p TAVR 05/2021.  Most recent echo 05/2022 showed LVEF 60 to 65%, grade 1 DD, normal RV systolic function, moderate to severe MAC with trivial mitral regurgitation, normally functioning bioprosthetic aortic valve with mean gradient 8 mmHg.  Last seen by Dr. Lavona 02/07/2024, bruit noted at that time, carotid ultrasound ordered.  Duplex 02/17/2024 showed 80 to 99% R ICA stenosis and 1 to 39% LICA stenosis.  Dr. Lavona referred the patient to vascular surgery.  He was seen by Dr. Pearline 03/13/2024 who offered TCAR for stroke prevention.  Other pertinent history includes C3-6 ACDF.  Preop labs reviewed, WNL.  EKG 02/07/2024: Sinus bradycardia with first-degree AV block.  Rate 57.  TTE 06/08/2022: 1. Left ventricular ejection fraction, by estimation, is 60 to 65%. The  left ventricle has normal function. The left ventricle has no regional  wall motion abnormalities. There is moderate left ventricular hypertrophy.  Left ventricular diastolic  parameters are consistent with Grade I diastolic dysfunction (impaired  relaxation).   2. Right ventricular systolic function is normal. The right ventricular  size is normal. There is normal pulmonary artery systolic pressure.   3. Left atrial size was mildly dilated.   4. Trivial mitral valve regurgitation. Moderate to severe mitral annular  calcification.   5. S/p TAVR 29 mm Sapien 3. Normal prosthesis. Aortic valve regurgitation  is not visualized. Aortic valve area, by VTI measures 1.42 cm. Aortic  valve mean gradient measures 8.0 mmHg. Aortic valve Vmax measures 2.11  m/s.   6. The inferior vena cava is normal in size with greater than 50%  respiratory variability, suggesting right atrial pressure of 3 mmHg.     Lynwood Geofm RIGGERS [facility] Short Stay Center/Anesthesiology Phone [phone] 04/16/2024  3:07 PM

## 2024-04-23 ENCOUNTER — Inpatient Hospital Stay (HOSPITAL_COMMUNITY)

## 2024-04-23 ENCOUNTER — Encounter (HOSPITAL_COMMUNITY)
Admission: RE | Disposition: A | Discharge status: Home / Self Care | Payer: Self-pay | Source: Home / Self Care | Attending: Vascular Surgery

## 2024-04-23 ENCOUNTER — Inpatient Hospital Stay (HOSPITAL_COMMUNITY): Payer: Self-pay | Admitting: Physician Assistant

## 2024-04-23 ENCOUNTER — Inpatient Hospital Stay (HOSPITAL_COMMUNITY)
Admission: RE | Admit: 2024-04-23 | Discharge: 2024-04-24 | DRG: 035 | Disposition: A | Discharge status: Home / Self Care | Attending: Vascular Surgery | Admitting: Vascular Surgery

## 2024-04-23 DIAGNOSIS — I252 Old myocardial infarction: Secondary | ICD-10-CM

## 2024-04-23 DIAGNOSIS — I2510 Atherosclerotic heart disease of native coronary artery without angina pectoris: Secondary | ICD-10-CM | POA: Diagnosis present

## 2024-04-23 DIAGNOSIS — I10 Essential (primary) hypertension: Secondary | ICD-10-CM | POA: Diagnosis not present

## 2024-04-23 DIAGNOSIS — N189 Chronic kidney disease, unspecified: Secondary | ICD-10-CM | POA: Diagnosis present

## 2024-04-23 DIAGNOSIS — E039 Hypothyroidism, unspecified: Secondary | ICD-10-CM

## 2024-04-23 DIAGNOSIS — Z952 Presence of prosthetic heart valve: Secondary | ICD-10-CM

## 2024-04-23 DIAGNOSIS — Z87442 Personal history of urinary calculi: Secondary | ICD-10-CM

## 2024-04-23 DIAGNOSIS — Z7982 Long term (current) use of aspirin: Secondary | ICD-10-CM

## 2024-04-23 DIAGNOSIS — Z91018 Allergy to other foods: Secondary | ICD-10-CM

## 2024-04-23 DIAGNOSIS — I472 Ventricular tachycardia, unspecified: Secondary | ICD-10-CM | POA: Diagnosis present

## 2024-04-23 DIAGNOSIS — Z955 Presence of coronary angioplasty implant and graft: Secondary | ICD-10-CM

## 2024-04-23 DIAGNOSIS — I6521 Occlusion and stenosis of right carotid artery: Secondary | ICD-10-CM

## 2024-04-23 DIAGNOSIS — I739 Peripheral vascular disease, unspecified: Secondary | ICD-10-CM | POA: Diagnosis present

## 2024-04-23 DIAGNOSIS — M069 Rheumatoid arthritis, unspecified: Secondary | ICD-10-CM | POA: Diagnosis present

## 2024-04-23 DIAGNOSIS — I129 Hypertensive chronic kidney disease with stage 1 through stage 4 chronic kidney disease, or unspecified chronic kidney disease: Secondary | ICD-10-CM | POA: Diagnosis present

## 2024-04-23 DIAGNOSIS — I6529 Occlusion and stenosis of unspecified carotid artery: Secondary | ICD-10-CM | POA: Diagnosis present

## 2024-04-23 DIAGNOSIS — I6523 Occlusion and stenosis of bilateral carotid arteries: Principal | ICD-10-CM | POA: Diagnosis present

## 2024-04-23 DIAGNOSIS — Z79899 Other long term (current) drug therapy: Secondary | ICD-10-CM

## 2024-04-23 DIAGNOSIS — R001 Bradycardia, unspecified: Secondary | ICD-10-CM | POA: Diagnosis not present

## 2024-04-23 DIAGNOSIS — Z886 Allergy status to analgesic agent status: Secondary | ICD-10-CM

## 2024-04-23 MED ORDER — CLEVIDIPINE BUTYRATE 0.5 MG/ML IV EMUL
INTRAVENOUS | Status: DC | PRN
  Administered 2024-04-23: 2 mg/h via INTRAVENOUS

## 2024-04-23 MED ORDER — CEFAZOLIN SODIUM-DEXTROSE 2-4 GM/100ML-% IV SOLN
2.0000 g | INTRAVENOUS | Status: AC
  Administered 2024-04-23: 2 g via INTRAVENOUS
  Filled 2024-04-23: qty 100

## 2024-04-23 MED ORDER — IODIXANOL 320 MG/ML IV SOLN
INTRAVENOUS | Status: DC | PRN
  Administered 2024-04-23: 16 mL via INTRA_ARTERIAL

## 2024-04-23 MED ORDER — CLEVIDIPINE BUTYRATE 0.5 MG/ML IV EMUL
INTRAVENOUS | Status: AC
  Filled 2024-04-23: qty 50

## 2024-04-23 MED ORDER — HEMOSTATIC AGENTS (NO CHARGE) OPTIME
TOPICAL | Status: DC | PRN
  Administered 2024-04-23: 1 via TOPICAL

## 2024-04-23 MED ORDER — ACETAMINOPHEN 10 MG/ML IV SOLN: 1000.0000 mg | Freq: Once | INTRAVENOUS | Status: DC | PRN

## 2024-04-23 MED ORDER — ONDANSETRON HCL 4 MG/2ML IJ SOLN: 4.0000 mg | Freq: Four times a day (QID) | INTRAMUSCULAR | Status: DC | PRN

## 2024-04-23 MED ORDER — FENTANYL CITRATE (PF) 250 MCG/5ML IJ SOLN
INTRAMUSCULAR | Status: DC | PRN
  Administered 2024-04-23: 100 ug via INTRAVENOUS

## 2024-04-23 MED ORDER — DOCUSATE SODIUM 100 MG PO CAPS
100.0000 mg | ORAL_CAPSULE | Freq: Every day | ORAL | Status: DC
  Filled 2024-04-23: qty 1

## 2024-04-23 MED ORDER — LIDOCAINE HCL 1 % IJ SOLN
INTRAMUSCULAR | Status: AC
  Filled 2024-04-23: qty 20

## 2024-04-23 MED ORDER — OXYCODONE HCL 5 MG PO TABS: 5.0000 mg | ORAL_TABLET | Freq: Once | ORAL | Status: DC | PRN

## 2024-04-23 MED ORDER — NOREPINEPHRINE 4 MG/250ML-% IV SOLN
INTRAVENOUS | Status: AC
  Filled 2024-04-23: qty 250

## 2024-04-23 MED ORDER — ORAL CARE MOUTH RINSE: 15.0000 mL | Freq: Once | OROMUCOSAL | Status: AC

## 2024-04-23 MED ORDER — SODIUM CHLORIDE 0.9 % IV SOLN: INTRAVENOUS | Status: DC

## 2024-04-23 MED ORDER — PHENOL 1.4 % MT LIQD: 1.0000 | OROMUCOSAL | Status: DC | PRN

## 2024-04-23 MED ORDER — HYDRALAZINE HCL 20 MG/ML IJ SOLN: 5.0000 mg | INTRAMUSCULAR | Status: DC | PRN

## 2024-04-23 MED ORDER — CEFAZOLIN SODIUM-DEXTROSE 2-4 GM/100ML-% IV SOLN
2.0000 g | Freq: Three times a day (TID) | INTRAVENOUS | Status: AC
  Administered 2024-04-23 (×2): 2 g via INTRAVENOUS
  Filled 2024-04-23 (×2): qty 100

## 2024-04-23 MED ORDER — HEPARIN SODIUM (PORCINE) 1000 UNIT/ML IJ SOLN
INTRAMUSCULAR | Status: DC | PRN
  Administered 2024-04-23: 8000 [IU] via INTRAVENOUS
  Administered 2024-04-23: 1000 [IU] via INTRAVENOUS

## 2024-04-23 MED ORDER — POTASSIUM CHLORIDE CRYS ER 20 MEQ PO TBCR: 40.0000 meq | EXTENDED_RELEASE_TABLET | Freq: Every day | ORAL | Status: DC | PRN

## 2024-04-23 MED ORDER — DEXAMETHASONE SOD PHOSPHATE PF 10 MG/ML IJ SOLN
INTRAMUSCULAR | Status: DC | PRN
  Administered 2024-04-23: 10 mg via INTRAVENOUS

## 2024-04-23 MED ORDER — LABETALOL HCL 5 MG/ML IV SOLN: 10.0000 mg | INTRAVENOUS | Status: DC | PRN

## 2024-04-23 MED ORDER — LIDOCAINE 2% (20 MG/ML) 5 ML SYRINGE
INTRAMUSCULAR | Status: DC | PRN
  Administered 2024-04-23: 100 mg via INTRAVENOUS

## 2024-04-23 MED ORDER — PHENYLEPHRINE 80 MCG/ML (10ML) SYRINGE FOR IV PUSH (FOR BLOOD PRESSURE SUPPORT)
PREFILLED_SYRINGE | INTRAVENOUS | Status: DC | PRN
  Administered 2024-04-23: 40 ug via INTRAVENOUS

## 2024-04-23 MED ORDER — PHENYLEPHRINE HCL-NACL 20-0.9 MG/250ML-% IV SOLN
INTRAVENOUS | Status: DC | PRN
  Administered 2024-04-23: 30 ug/min via INTRAVENOUS

## 2024-04-23 MED ORDER — ROCURONIUM BROMIDE 10 MG/ML (PF) SYRINGE
PREFILLED_SYRINGE | INTRAVENOUS | Status: DC | PRN
  Administered 2024-04-23: 30 mg via INTRAVENOUS
  Administered 2024-04-23: 70 mg via INTRAVENOUS

## 2024-04-23 MED ORDER — POLYETHYLENE GLYCOL 3350 17 G PO PACK: 17.0000 g | PACK | Freq: Every day | ORAL | Status: DC | PRN

## 2024-04-23 MED ORDER — PROPOFOL 10 MG/ML IV BOLUS
INTRAVENOUS | Status: AC
  Filled 2024-04-23: qty 20

## 2024-04-23 MED ORDER — CLOPIDOGREL BISULFATE 75 MG PO TABS
75.0000 mg | ORAL_TABLET | Freq: Every day | ORAL | Status: DC
  Administered 2024-04-23 – 2024-04-24 (×2): 75 mg via ORAL
  Filled 2024-04-23 (×2): qty 1

## 2024-04-23 MED ORDER — HYDRALAZINE HCL 20 MG/ML IJ SOLN
INTRAMUSCULAR | Status: AC
  Filled 2024-04-23: qty 1

## 2024-04-23 MED ORDER — LACTATED RINGERS IV SOLN: INTRAVENOUS | Status: DC

## 2024-04-23 MED ORDER — MORPHINE SULFATE (PF) 2 MG/ML IV SOLN: 2.0000 mg | INTRAVENOUS | Status: DC | PRN

## 2024-04-23 MED ORDER — 0.9 % SODIUM CHLORIDE (POUR BTL) OPTIME
TOPICAL | Status: DC | PRN
  Administered 2024-04-23: 1000 mL

## 2024-04-23 MED ORDER — CHLORHEXIDINE GLUCONATE 0.12 % MT SOLN
15.0000 mL | Freq: Once | OROMUCOSAL | Status: AC
  Administered 2024-04-23: 15 mL via OROMUCOSAL
  Filled 2024-04-23: qty 15

## 2024-04-23 MED ORDER — HEPARIN 6000 UNIT IRRIGATION SOLUTION
Status: DC | PRN
  Administered 2024-04-23: 1

## 2024-04-23 MED ORDER — SODIUM CHLORIDE 0.9 % IV SOLN: 500.0000 mL | Freq: Once | INTRAVENOUS | Status: DC | PRN

## 2024-04-23 MED ORDER — THYROID 30 MG PO TABS
30.0000 mg | ORAL_TABLET | Freq: Every day | ORAL | Status: DC
  Administered 2024-04-24: 30 mg via ORAL
  Filled 2024-04-23: qty 1

## 2024-04-23 MED ORDER — BISACODYL 5 MG PO TBEC: 5.0000 mg | DELAYED_RELEASE_TABLET | Freq: Every day | ORAL | Status: DC | PRN

## 2024-04-23 MED ORDER — PROTAMINE SULFATE 10 MG/ML IV SOLN
INTRAVENOUS | Status: DC | PRN
  Administered 2024-04-23: 50 mg via INTRAVENOUS

## 2024-04-23 MED ORDER — AMLODIPINE BESYLATE 5 MG PO TABS
5.0000 mg | ORAL_TABLET | Freq: Every morning | ORAL | Status: DC
  Administered 2024-04-24: 5 mg via ORAL
  Filled 2024-04-23: qty 1

## 2024-04-23 MED ORDER — ONDANSETRON HCL 4 MG/2ML IJ SOLN: 4.0000 mg | Freq: Once | INTRAMUSCULAR | Status: DC | PRN

## 2024-04-23 MED ORDER — ALBUMIN HUMAN 5 % IV SOLN: INTRAVENOUS | Status: DC | PRN

## 2024-04-23 MED ORDER — GLYCOPYRROLATE 0.2 MG/ML IJ SOLN
INTRAMUSCULAR | Status: DC | PRN
  Administered 2024-04-23 (×2): .2 mg via INTRAVENOUS

## 2024-04-23 MED ORDER — ACETAMINOPHEN 650 MG RE SUPP: 325.0000 mg | RECTAL | Status: DC | PRN

## 2024-04-23 MED ORDER — EPINEPHRINE HCL 5 MG/250ML IV SOLN IN NS
INTRAVENOUS | Status: AC
  Filled 2024-04-23: qty 250

## 2024-04-23 MED ORDER — ACETAMINOPHEN 325 MG PO TABS: 325.0000 mg | ORAL_TABLET | ORAL | Status: DC | PRN

## 2024-04-23 MED ORDER — FENTANYL CITRATE (PF) 100 MCG/2ML IJ SOLN: 25.0000 ug | INTRAMUSCULAR | Status: DC | PRN

## 2024-04-23 MED ORDER — PROPOFOL 10 MG/ML IV BOLUS
INTRAVENOUS | Status: DC | PRN
  Administered 2024-04-23 (×2): 50 mg via INTRAVENOUS

## 2024-04-23 MED ORDER — LIDOCAINE 2% (20 MG/ML) 5 ML SYRINGE
INTRAMUSCULAR | Status: AC
  Filled 2024-04-23: qty 5

## 2024-04-23 MED ORDER — CHLORHEXIDINE GLUCONATE CLOTH 2 % EX PADS
6.0000 | MEDICATED_PAD | Freq: Once | CUTANEOUS | Status: AC
  Administered 2024-04-23: 6 via TOPICAL

## 2024-04-23 MED ORDER — HYDRALAZINE HCL 20 MG/ML IJ SOLN
5.0000 mg | Freq: Once | INTRAMUSCULAR | Status: AC
  Administered 2024-04-23: 5 mg via INTRAVENOUS

## 2024-04-23 MED ORDER — SUGAMMADEX SODIUM 200 MG/2ML IV SOLN
INTRAVENOUS | Status: DC | PRN
  Administered 2024-04-23: 200 mg via INTRAVENOUS

## 2024-04-23 MED ORDER — FENTANYL CITRATE (PF) 100 MCG/2ML IJ SOLN
INTRAMUSCULAR | Status: AC
  Filled 2024-04-23: qty 2

## 2024-04-23 MED ORDER — EPHEDRINE SULFATE-NACL 50-0.9 MG/10ML-% IV SOSY
PREFILLED_SYRINGE | INTRAVENOUS | Status: DC | PRN
  Administered 2024-04-23 (×3): 5 mg via INTRAVENOUS

## 2024-04-23 MED ORDER — HEPARIN 6000 UNIT IRRIGATION SOLUTION
Status: AC
  Filled 2024-04-23: qty 500

## 2024-04-23 MED ORDER — OXYCODONE HCL 5 MG/5ML PO SOLN: 5.0000 mg | Freq: Once | ORAL | Status: DC | PRN

## 2024-04-23 MED ORDER — AMISULPRIDE (ANTIEMETIC) 5 MG/2ML IV SOLN: 10.0000 mg | Freq: Once | INTRAVENOUS | Status: DC | PRN

## 2024-04-23 MED ORDER — CHLORHEXIDINE GLUCONATE CLOTH 2 % EX PADS: 6.0000 | MEDICATED_PAD | Freq: Once | CUTANEOUS | Status: DC

## 2024-04-23 MED ORDER — OXYCODONE-ACETAMINOPHEN 5-325 MG PO TABS: 1.0000 | ORAL_TABLET | ORAL | Status: DC | PRN

## 2024-04-23 MED ORDER — METOPROLOL TARTRATE 5 MG/5ML IV SOLN: 2.5000 mg | INTRAVENOUS | Status: DC | PRN

## 2024-04-23 MED ORDER — ASPIRIN 81 MG PO TBEC
81.0000 mg | DELAYED_RELEASE_TABLET | Freq: Every day | ORAL | Status: DC
  Administered 2024-04-24: 81 mg via ORAL
  Filled 2024-04-23: qty 1

## 2024-04-23 NOTE — Plan of Care (Signed)
" °  Problem: Neurological: Goal: Will regain or maintain usual level of consciousness Outcome: Progressing   Problem: Clinical Measurements: Goal: Postoperative complications will be avoided or minimized Outcome: Progressing   Problem: Coping: Goal: Level of anxiety will decrease Outcome: Progressing   Problem: Safety: Goal: Ability to remain free from injury will improve Outcome: Progressing   "

## 2024-04-23 NOTE — H&P (Signed)
 Patient seen and examined.  No complaints. No changes to medication history or physical exam since last seen. After discussing the risks and benefits of R TCARE, Joseph Potts. elected to proceed.   Norman GORMAN Serve MD       Patient ID: Joseph Potts., male   DOB: [birth year], 89 y.o.   MRN: [id number]   Reason for Consult: New Patient (Initial Visit)   Referred by Orlie Norris, MD   Subjective:    Subjective HPI Joseph Potts. is a 89 y.o. male presenting for evaluation of carotid artery stenosis.  He has history of aortic stenosis and underwent TAVR in March 2023.  He has been doing well from a cardiac standpoint and recently saw his cardiologist, Dr. Lavona in November 2025 who obtained a carotid duplex when he noted a bruit. He denies any recent strokes or strokelike symptoms.  Specifically denies any one-sided weakness, numbness, amaurosis or speech issues.  He has had cervical neck surgery with an ACDF.       Past Medical History:  Diagnosis Date   Bilateral carotid artery stenosis      per duplex  09-05-2012--  RICA 1-39%,  LICA 40-59%   CAD (coronary artery disease)     CAD (coronary artery disease)     Chronic kidney disease     History of paroxysmal supraventricular tachycardia     History of pneumothorax      2008--  right side spontanenous, chest tube--  resolved   History of syncope      vasovagal response sitting of pneumothorax   Kidney stones     Right ureteral calculus     S/P TAVR (transcatheter aortic valve replacement) 05/30/2021    s/p TAVR with a 29 mm Edwards S3UR via the TF approach by Dr. Wendel and Dr. Lucas   Severe aortic stenosis               Family History  Problem Relation Age of Onset   Arrhythmia Mother          Stroke   Arrhythmia Father          CNS bleed             Past Surgical History:  Procedure Laterality Date   ANAL FISSURE REPAIR   1985   ANTERIOR CERVICAL DECOMP/DISCECTOMY FUSION N/A  02/16/2014    Procedure: CERVICAL THREE TO FOUR, CERVICAL FOUR TO FIVE, CERVICAL FIVE TO SIX  ANTERIOR CERVICAL DECOMPRESSION/DISKECTOMY/FUSION;  Surgeon: Catalina CHRISTELLA Stains, MD;  Location: [facility];  Service: Neurosurgery;  Laterality: N/A;  C3-4 C4-5 C5-6 ANTERIOR CERVICAL DECOMPRESSION/DISKECTOMY/FUSION   COLONOSCOPY       FULLTHICKNESS SKIN LESION RESECTION RIGHT LONG/ RING WEB SPACE   03-15-2006   INTRAOPERATIVE TRANSTHORACIC ECHOCARDIOGRAM N/A 05/30/2021    Procedure: INTRAOPERATIVE TRANSTHORACIC ECHOCARDIOGRAM;  Surgeon: Wendel Lurena POUR, MD;  Location: [facility] OR;  Service: Open Heart Surgery;  Laterality: N/A;   KNEE ARTHROSCOPY W/ MENISCECTOMY Left 05-18-2010   LUMBAR DISC SURGERY   1973    L4 -- L5   TENDON REPAIR   yrs ago    hand laceration   TRANSCATHETER AORTIC VALVE REPLACEMENT, TRANSFEMORAL Left 05/30/2021    Procedure: Transcatheter Aortic Valve Replacement, Transfemoral;  Surgeon: Wendel Lurena POUR, MD;  Location: [facility] OR;  Service: Open Heart Surgery;  Laterality: Left;   TRANSTHORACIC ECHOCARDIOGRAM   09-06-2011    mild LVH, grade 1 diastolic dysfunction, ef 55-65%/  mild AV stenosis and  mild AR/  trivial MR ,PR and TR          Short Social History:  Social History        Tobacco Use   Smoking status: Never   Smokeless tobacco: Never  Substance Use Topics   Alcohol use: No      [Allergies]  [Allergies]     Allergen Reactions   Ibuprofen Swelling   Ginger Swelling           Current Outpatient Medications  Medication Sig Dispense Refill   amLODipine  (NORVASC ) 5 MG tablet Take 0.5 tablets (2.5 mg total) by mouth daily. (Patient taking differently: Take 2.5 mg by mouth daily. Takes 5 mg) 90 tablet 3   aspirin  EC 81 MG tablet Take 1 tablet (81 mg total) by mouth daily. Swallow whole. 1 tablet 0   Cholecalciferol (VITAMIN D3 PO) Take 8,000 Units by mouth daily.       Chromium (CHROMEMATE PO) Take by mouth daily.       MAGNESIUM  GLYCINATE PO Take 2 mg by mouth.        pravastatin  (PRAVACHOL ) 40 MG tablet TAKE 1 TABLET BY MOUTH EVERY EVENING 30 tablet 0   Probiotic Product (PROBIOTIC-10 PO) Take 1 capsule by mouth daily.       UNABLE TO FIND Take 3 capsules by mouth in the morning and at bedtime. Essential Fatty Acids - 2 brands       VITAMIN K PO Take 1 tablet by mouth daily.       zinc gluconate 50 MG tablet Take 50 mg by mouth daily.          No current facility-administered medications for this visit.        REVIEW OF SYSTEMS  All other systems were reviewed and are negative       Objective:    Objective[] Expand by Default     Vitals:    03/13/24 1348 03/13/24 1350  BP: 120/60 122/61  Pulse: 67    Resp: 18    Temp: 98.5 F (36.9 C)    TempSrc: Temporal    SpO2: 99%    Weight: 176 lb 12.8 oz (80.2 kg)    Height: 6' (1.829 m)      Body mass index is 23.98 kg/m.   Physical Exam General: no acute distress Cardiac: hemodynamically stable Extremities: no edema, cyanosis or wounds   Data: Carotid duplex Right Carotid Findings:  +----------+--------+--------+--------+------------------+--------+           PSV cm/sEDV cm/sStenosisPlaque DescriptionComments  +----------+--------+--------+--------+------------------+--------+  CCA Prox  53      6                                           +----------+--------+--------+--------+------------------+--------+  CCA Mid   52      7                                           +----------+--------+--------+--------+------------------+--------+  CCA Distal35      3               homogeneous                 +----------+--------+--------+--------+------------------+--------+  ICA Prox  654     180     80-99%  heterogenous                +----------+--------+--------+--------+------------------+--------+  ICA Mid   93      26                                          +----------+--------+--------+--------+------------------+--------+  ICA Distal44       16                                          +----------+--------+--------+--------+------------------+--------+  ECA      160     6               heterogenous                +----------+--------+--------+--------+------------------+--------+   +----------+--------+-------+----------------+-------------------+           PSV cm/sEDV cmsDescribe        Arm Pressure (mmHG)  +----------+--------+-------+----------------+-------------------+  [id number]     2      Multiphasic, [id number]                  +----------+--------+-------+----------------+-------------------+   +---------+--------+--+--------+--+---------+  VertebralPSV cm/s36EDV cm/s10Antegrade  +---------+--------+--+--------+--+---------+      Left Carotid Findings:  +----------+--------+--------+--------+------------------+--------+           PSV cm/sEDV cm/sStenosisPlaque DescriptionComments  +----------+--------+--------+--------+------------------+--------+  CCA Prox  85      15              heterogenous                +----------+--------+--------+--------+------------------+--------+  CCA Mid   115     14              heterogenous                +----------+--------+--------+--------+------------------+--------+  CCA Distal121     11              heterogenous                +----------+--------+--------+--------+------------------+--------+  ICA Prox  123     28      1-39%   heterogenous                +----------+--------+--------+--------+------------------+--------+  ICA Mid   127     19                                          +----------+--------+--------+--------+------------------+--------+  ICA Distal87      16                                          +----------+--------+--------+--------+------------------+--------+  ECA      101     1               heterogenous                 +----------+--------+--------+--------+------------------+--------+   +----------+--------+--------+----------------+-------------------+           PSV cm/sEDV cm/sDescribe        Arm Pressure (mmHG)  +----------+--------+--------+----------------+-------------------+  [id number]    1  Multiphasic, [id number]                  +----------+--------+--------+----------------+-------------------+   +---------+--------+--+--------+--+---------+  VertebralPSV cm/s71EDV cm/s16Antegrade  +---------+--------+--+--------+--+---------+    CTA Severe string sign stenosis of the right proximal ICA      Assessment/Plan:    Joseph Potts. is a 89 y.o. male with asymptomatic severe greater than 90% stenosis of the right ICA. We discussed the 3 modalities of treatment with best medical therapy, carotid endarterectomy and carotid stenting. We discussed the risks and benefits of carotid intrevention.  I informed that there is a small risk of stroke during the procedure which is about 1 to 2% but with medical therapy alone there is a 12% risk of ipsilateral stroke over the next 2 years.  I explained that with intervention, that risk is significantly reduced.  We also discussed the risk of cranial nerve injury, bleeding, infection and the small risk of restenosis in the future. I offered TCAR due to his age and previous history of neck surgery.   He elects to proceed. Plan for R TCAR in January. He will need to start 75mg  plavix  daily starting 7 days prior to the procedures.      Recommendations to optimize cardiovascular risk: Abstinence from all tobacco products. Blood glucose control with goal A1c < 7%. Blood pressure control with goal blood pressure < 140/90 mmHg. Lipid reduction therapy with goal LDL-C <55 mg/dL  Aspirin  81mg  PO QD.  Atorvastatin 40-80mg  PO QD (or other high intensity statin therapy).     Norman GORMAN Serve MD [facility]

## 2024-04-23 NOTE — Progress Notes (Signed)
 Patient arrived in the unit, V/S obtained, CCMD notified, NIH score 0, all needs met, call bbell in reach, family at bedside.    04/23/24 1145  Vitals  Temp (!) 97.4 F (36.3 C)  Temp Source Oral  BP (!) 126/49  MAP (mmHg) 71  BP Location Right Arm  BP Method Automatic  Patient Position (if appropriate) Lying  Pulse Rate (!) 45  Pulse Rate Source Monitor  ECG Heart Rate (!) 44  Resp 16  Level of Consciousness  Level of Consciousness Alert  Oxygen Therapy  SpO2 100 %  O2 Device Room Air  Art Line  Arterial Line BP 158/82  Arterial Line MAP (mmHg) 94 mmHg  Pain Assessment  Pain Scale 0-10  Pain Score 0

## 2024-04-23 NOTE — Op Note (Signed)
 "   OPERATIVE NOTE  PROCEDURE:   Right carotid stent placement (TCAR) Flow reversal embolic neuro-protection Ultrasound-guided left femoral vein access  PRE-OPERATIVE DIAGNOSIS: asymptomatic carotid artery stenosis  POST-OPERATIVE DIAGNOSIS: same as above   SURGEON: Norman GORMAN Serve MD  ASSISTANT(S): Adina Sender, PA  Given the complexity of the case,  the assistant was necessary in order to expedient the procedure and safely perform the technical aspects of the operation.  The assistant provided traction and countertraction while inserting the TCAR sheath. The assistant also played a critical role in pinning wires while advancing balloons and the stent across the critical stenosis. These skills could not have been adequately performed by a scrub tech assistant.    ANESTHESIA: general  ESTIMATED BLOOD LOSS: minimal  FINDING(S): Severe stenosis of the right occipital ICA.  Wide patency of stent with brisk flow and minimal residual stenosis on completion.  SPECIMEN(S): None  INDICATIONS:   Joseph Jeff. is a 89 y.o. male who presents with right asymptomatic carotid stenosis 90%.  I discussed with the patient the risks, benefits, medical management, carotid endarterectomy and TCAR.  The patient is a candidate for TCAR. I discussed the procedural details of both carotid endarterectomy and TCAR with the patient and recommended TCAR based on his anatomy and medical comorbidity's.  The patient is aware that the risks of TCAR include but are not limited to: bleeding, infection, stroke, myocardial infarction, death, cranial nerve injuries both temporary and permanent, neck hematoma, possible airway compromise, labile blood pressure post-operatively, cerebral hyperperfusion syndrome, and possible need for additional interventions in the future. The patient is aware of the risks and agrees to proceed forward with the procedure.  DESCRIPTION: The patient was transferred to the operating room  and positioned supine on the operating room table. Anesthesia was induced. The neck and groins were prepped and draped in standard fashion. A surgical timeout was performed confirming correct patient, procedure, and operative location.  The left common femoral vein (CFV) was accessed under ultrasound guidance, using standard Seldinger and micropuncture access technique. Permanent recorded image(s) was/were saved in the patient's medical record. The Venous Return Sheath was advanced into the CFV over the 0.035 wire provided. Blood was aspirated from the flow line followed by flushing of the Venous Sheath with heparinized saline. The Venous Sheath was secured to the patients skin with suture to maintain optimal position in the vessel.   Using intraoperative ultrasound the course of the common carotid artery was mapped on the skin. A longitudinal 3-4 cm incision was made between the sternal and clavicular heads of the sternocleidomastoid muscle. The omohyoid was transected. Following longitudinal division of the carotid sheath the jugular vein was partially dissected and retracted laterally. Once 3 cm of common carotid artery (CCA) were isolated, umbilical tape was placed around the proximal 1/3 of the CCA under direct vision. A vessel loop was also placed around the artery just proximal to the umbilical tape in Pots fashion. A 5-0 polypropylene suture was pre-placed in the anterior wall of the CCA, in a U stitch configuration, close to the clavicle to facilitate hemostasis upon removal of the arterial sheath at completion of the TCAR procedure.  Heparin  was given to obtain a therapeutic activated clotting time >250 seconds prior to arterial access. A 4-French non-stiffened ENHANCE Transcarotid / Peripheral Access set was used, puncturing the artery with the 21G needle through the pre-placed U stitch while holding gentle traction on the umbilical tape to stabilize and centralize the CCA within  the  incision. Careful attention was paid to the change in CCA shape when using the umbilical tape to control or lift the artery. The micropuncture wire was then advanced 3-4 cm into the CCA and, the 21G needle was removed. The micropuncture sheath was advanced 2-3 cm into the CCA and the wire and dilator were removed. Pulsatile backflow indicated correct positioning. The provided 0.035 J-tipped guidewire was inserted as close as possible to the bifurcation without engaging the lesion. After micropuncture sheath removal, the Transcarotid Arterial Sheath was advanced to the 2.5cm marker and the 0.035 wire and dilator were then removed. Arterial Sheath position was assessed under fluoroscopy in two projections to ensure that the sheath tip was oriented coaxially in the CCA. The Arterial Sheath was sutured to the patient with gentle forward tension. Blood was slowly aspirated followed by flushing with heparinized saline. No ingress of air bubbles through the passive hemostatic valve was observed. The stopcocks were closed. Traction applied to the CCA previously to facilitate access was gently released.   The Flow Controller was connected to the Transcarotid Arterial Sheath, prepared by passively allowing a column of arterial blood to fill the line and connected to the Venous Return Sheath. Passive flow reversal was confirmed with a saline bolus deliver into the venous flow line on the Low flow setting of the Flow Controller. The flow setting was switched to High and the CCA inflow was occluded proximal to the arteriotomy with the vessel loop to achieve active flow reversal. To confirm active flow reversal, a saline bolus was delivered into the venous flow line on the High flow setting of the Flow Controller. Angiograms were performed with slow injections of a small amount of contrast filling just past the lesion to minimize antegrade transmission of micro-bubbles.  Prior to lesion manipulation, heart rate  (70bpm) and systolic BP (90-110 MAP) were managed upwards to optimize flow reversal and procedural neuroprotection. The lesion was crossed with an 0.014 ENROUTE guidewire and pre-dilation of the lesion was performed with a 6 mm x 35 mm rapid exchange 0.014 compatible balloon catheter to 8 atmospheres. Stenting was performed with an 10 mm x 40 mm ENROUTE Transcarotid stent, sized appropriately to the CCA. AP and lateral angiograms (gentle contrast injections) were performed to confirm stent placement and arterial wall stent apposition.  At [facility] case completion, antegrade flow was restored by releasing the vessel loop on the CCA then closing the NPS stopcocks to the flow lines. The Transcarotid Arterial Sheath was removed and the pre-closure suture was tied. Heparin  was reversed, the wound was copiously irrigated and hemostasis achieved. The heads of the SCM was re-approximated with a 2-0 Vicryl, the platysma was closed with a 3-0 Vicryl and the skin with 4-0 Monocryl.   The Venous Return Sheath was removed and hemostasis was achieved with brief manual compression.  The patient tolerated the procedure well and was extubated on the table. The patient was moving all four extremities to command prior to transfer to the recovery room.    COMPLICATIONS: None  CONDITION: Stable  Norman GORMAN Serve MD [facility] Phone Number: [phone] 04/23/2024 9:52 AM  "

## 2024-04-23 NOTE — Anesthesia Postprocedure Evaluation (Signed)
"   Anesthesia Post Note  Patient: Joseph Potts.  Procedure(s) Performed: TRANSCAROTID ARTERY REVASCULARIZATION (TCAR) (Right)     Patient location during evaluation: PACU Anesthesia Type: General Level of consciousness: awake Pain management: pain level controlled Vital Signs Assessment: post-procedure vital signs reviewed and stable Respiratory status: spontaneous breathing Cardiovascular status: stable Postop Assessment: no apparent nausea or vomiting Anesthetic complications: no Comments: Hypertensive on arrival to PACU - resolved with 5 mg of IV Hydralazine  x 1. Stable for transfer to the floor.   There were no known notable events for this encounter.                 Lauraine DASEN Colhoun      "

## 2024-04-23 NOTE — Anesthesia Procedure Notes (Signed)
 Procedure Name: Intubation Date/Time: 04/23/2024 7:47 AM  Performed by: Kearney Rosina SAILOR, RNPre-anesthesia Checklist: Patient identified, Emergency Drugs available, Suction available and Patient being monitored Patient Re-evaluated:Patient Re-evaluated prior to induction Oxygen Delivery Method: Circle system utilized Preoxygenation: Pre-oxygenation with 100% oxygen Induction Type: IV induction Ventilation: Mask ventilation without difficulty Laryngoscope Size: Glidescope and 4 Grade View: Grade I Tube type: Oral Number of attempts: 1 Airway Equipment and Method: Stylet and Oral airway Placement Confirmation: ETT inserted through vocal cords under direct vision, positive ETCO2 and breath sounds checked- equal and bilateral Secured at: 24 cm Tube secured with: Tape Dental Injury: Teeth and Oropharynx as per pre-operative assessment  Comments: Atraumatic placement

## 2024-04-23 NOTE — Anesthesia Procedure Notes (Signed)
 Arterial Line Insertion Start/End1/29/2026 7:10 AM, 04/23/2024 7:15 AM Performed by: Dene Lauraine DASEN, MD, Kearney Rosina SAILOR, RN, CRNA  Patient location: Pre-op. Preanesthetic checklist: patient identified, IV checked, site marked, risks and benefits discussed, surgical consent, monitors and equipment checked, pre-op evaluation, timeout performed and anesthesia consent Lidocaine  1% used for infiltration Right, radial was placed Catheter size: 20 G Hand hygiene performed , maximum sterile barriers used  and Seldinger technique used Allen's test indicative of satisfactory collateral circulation Attempts: 1 Following insertion, dressing applied. Post procedure assessment: normal and unchanged  Patient tolerated the procedure well with no immediate complications. Additional procedure comments: Atraumatic .

## 2024-04-23 NOTE — Transfer of Care (Signed)
 Immediate Anesthesia Transfer of Care Note  Patient: Joseph Potts.  Procedure(s) Performed: TRANSCAROTID ARTERY REVASCULARIZATION (TCAR) (Right)  Patient Location: PACU  Anesthesia Type:General  Level of Consciousness: awake, alert , and oriented  Airway & Oxygen Therapy: Patient Spontanous Breathing and Patient connected to face mask oxygen  Post-op Assessment: Report given to RN, Post -op Vital signs reviewed and stable, and Patient moving all extremities  Post vital signs: Reviewed and stable  Last Vitals:  Vitals Value Taken Time  BP 126/55 04/23/24 09:33  Temp    Pulse 58 04/23/24 09:39  Resp 17 04/23/24 09:39  SpO2 100 % 04/23/24 09:39  Vitals shown include unfiled device data.  Last Pain:  Vitals:   04/23/24 0627  TempSrc:   PainSc: 0-No pain         Complications: There were no known notable events for this encounter.

## 2024-04-24 ENCOUNTER — Encounter (HOSPITAL_COMMUNITY): Payer: Self-pay | Admitting: Vascular Surgery

## 2024-04-24 ENCOUNTER — Other Ambulatory Visit (HOSPITAL_COMMUNITY): Payer: Self-pay

## 2024-04-24 LAB — CBC
HCT: 31.1 % — ABNORMAL LOW (ref 39.0–52.0)
Hemoglobin: 10.8 g/dL — ABNORMAL LOW (ref 13.0–17.0)
MCH: 33.6 pg (ref 26.0–34.0)
MCHC: 34.7 g/dL (ref 30.0–36.0)
MCV: 96.9 fL (ref 80.0–100.0)
Platelets: 167 10*3/uL (ref 150–400)
RBC: 3.21 MIL/uL — ABNORMAL LOW (ref 4.22–5.81)
RDW: 12.3 % (ref 11.5–15.5)
WBC: 14.5 10*3/uL — ABNORMAL HIGH (ref 4.0–10.5)
nRBC: 0 % (ref 0.0–0.2)

## 2024-04-24 LAB — BASIC METABOLIC PANEL WITH GFR
Anion gap: 9 (ref 5–15)
BUN: 21 mg/dL (ref 8–23)
CO2: 23 mmol/L (ref 22–32)
Calcium: 8.5 mg/dL — ABNORMAL LOW (ref 8.9–10.3)
Chloride: 103 mmol/L (ref 98–111)
Creatinine, Ser: 1.15 mg/dL (ref 0.61–1.24)
GFR, Estimated: >60 mL/min
Glucose, Bld: 170 mg/dL — ABNORMAL HIGH (ref 70–99)
Potassium: 4 mmol/L (ref 3.5–5.1)
Sodium: 136 mmol/L (ref 135–145)

## 2024-04-24 LAB — LIPID PANEL
Cholesterol: 112 mg/dL (ref 0–200)
HDL: 33 mg/dL — ABNORMAL LOW
LDL Cholesterol: 58 mg/dL (ref 0–99)
Total CHOL/HDL Ratio: 3.4 ratio
Triglycerides: 107 mg/dL
VLDL: 21 mg/dL (ref 0–40)

## 2024-04-24 LAB — MAGNESIUM: Magnesium: 2 mg/dL (ref 1.7–2.4)

## 2024-04-24 LAB — POCT ACTIVATED CLOTTING TIME
Activated Clotting Time: 220 s
Activated Clotting Time: 225 s

## 2024-04-24 MED ORDER — ROSUVASTATIN CALCIUM 10 MG PO TABS
10.0000 mg | ORAL_TABLET | Freq: Every day | ORAL | 11 refills | Status: DC
  Filled 2024-04-24: qty 30, 30d supply, fill #0

## 2024-04-24 MED ORDER — TRAMADOL HCL 50 MG PO TABS
50.0000 mg | ORAL_TABLET | Freq: Four times a day (QID) | ORAL | 0 refills | Status: AC | PRN
  Filled 2024-04-24: qty 10, 3d supply, fill #0

## 2024-04-24 NOTE — TOC Transition Note (Signed)
 Transition of Care [facility]) - Discharge Note Rayfield Gobble RN, BSN Inpatient Care Management Unit 4E- RN Case Manager See Treatment Team for direct phone #   Patient Details  Name: Joseph Potts. MRN: [id number] Date of Birth: Mar 24, 1933  Transition of Care [facility]) CM/SW Contact:  Gobble Rayfield Hurst, RN Phone Number: 04/24/2024, 12:39 PM   Clinical Narrative:    Pt stable for transition home today, family to transport home.   CM notified by [facility] that [facility] office made pre-op referral for [facility].  Liaison to follow up and if [facility] needed will anticipate visit first of next week depending on weather conditions (winter storm this weekend anticipated).   No further ICM needs noted.    Final next level of care: Home w Home Health Services Barriers to Discharge: No Barriers Identified   Patient Goals and CMS Choice     Choice offered to / list presented to :  ([facility] office referral to [facility])      Discharge Placement               Home        Discharge Plan and Services Additional resources added to the After Visit Summary for     Discharge Planning Services: CM Consult            DME Arranged: N/A DME Agency: NA         HH Agency: [facility] ([facility]) Date HH Agency Contacted: 04/24/24   Representative spoke with at [facility] Agency: [place]  Social Drivers of Health (SDOH) Interventions SDOH Screenings   Food Insecurity: No Food Insecurity (04/23/2024)  Housing: Low Risk (04/23/2024)  Transportation Needs: No Transportation Needs (04/23/2024)  Utilities: Not At Risk (04/23/2024)  Social Connections: Unknown (04/23/2024)  Tobacco Use: Low Risk (04/15/2024)     Readmission Risk Interventions     No data to display

## 2024-04-24 NOTE — Progress Notes (Signed)
 Notified by CCMD patient having pauses and HR dropping to 39. Looks to be heart block. No history that I can find that he has a history of. Patient is asymptomatic.  Patient stated that, he hasn't seen that low of HR in a long time. Patient denies CP/SOB. No lightheadedness nor dizziness. Patient has not been up yet since procedure. B/p stable.  EKG done: 2 different results. Placed in chart.

## 2024-04-24 NOTE — Plan of Care (Signed)
" °  Problem: Cardiac: Goal: Ability to maintain an adequate cardiac output Outcome: Progressing   Problem: Neurological: Goal: Will regain or maintain usual level of consciousness Outcome: Progressing   "

## 2024-04-24 NOTE — Progress Notes (Signed)
" °  Progress Note    04/24/2024 8:10 AM 1 Day Post-Op  Subjective:  No complaints. Says he feels good. Denies any SOB, Chest Pain, or dizziness. He has ambulated in [place]. Tolerating diet   Vitals:   04/24/24 0253 04/24/24 0516  BP: (!) 128/54 (!) 128/54  Pulse: 64 64  Resp: 14 14  Temp: 98.7 F (37.1 C) 98.7 F (37.1 C)  SpO2: 98% 98%   Physical Exam: Cardiac:  regular Lungs:  non labored Incisions:  right neck incision is c/d/I without swelling or hematoma Extremities:  moving all extremities without deficits Neurologic: alert and oriented. Speech coherent  CBC    Component Value Date/Time   WBC 14.5 (H) 04/24/2024 0250   RBC 3.21 (L) 04/24/2024 0250   HGB 10.8 (L) 04/24/2024 0250   HGB 13.9 09/23/2018 0844   HCT 31.1 (L) 04/24/2024 0250   HCT 42.0 09/23/2018 0844   PLT 167 04/24/2024 0250   PLT 204 09/23/2018 0844   MCV 96.9 04/24/2024 0250   MCV 101 (H) 09/23/2018 0844   MCH 33.6 04/24/2024 0250   MCHC 34.7 04/24/2024 0250   RDW 12.3 04/24/2024 0250   RDW 11.8 09/23/2018 0844   LYMPHSABS 1.9 09/23/2018 0844   MONOABS 1.1 (H) 11/19/2006 1210   EOSABS 0.3 09/23/2018 0844   BASOSABS 0.0 09/23/2018 0844    BMET    Component Value Date/Time   NA 136 04/24/2024 0250   NA 141 09/23/2018 0844   K 4.0 04/24/2024 0250   CL 103 04/24/2024 0250   CO2 23 04/24/2024 0250   GLUCOSE 170 (H) 04/24/2024 0250   BUN 21 04/24/2024 0250   BUN 19 09/23/2018 0844   CREATININE 1.15 04/24/2024 0250   CALCIUM  8.5 (L) 04/24/2024 0250   GFRNONAA >60 04/24/2024 0250   GFRAA 94 09/23/2018 0844    INR    Component Value Date/Time   INR 1.0 04/15/2024 0921     Intake/Output Summary (Last 24 hours) at 04/24/2024 0810 Last data filed at 04/24/2024 0557 Gross per 24 hour  Intake 2601.45 ml  Output 1270 ml  Net 1331.45 ml     Assessment/Plan:  89 y.o. male is s/p right TCAR 1 Day Post-Op   Neurologically intact Right neck incision is c/d/I without swelling or  hematoma Saw RN note regarding pauses and bradycardia. Patient remained asymptomatic. No recurrence Hemodynamically stable He is stable for discharge home today Continue Aspirin  and Plavix . Will add Statin. Pending lipid eval by pharmacy  Follow up arranged in 1 month with carotid duplex  Teretha Damme, PA-C [facility] [phone] 04/24/2024 8:10 AM "

## 2024-04-24 NOTE — Progress Notes (Signed)
 Reviewed AVS, patient expressed understanding of medications, MD follow up reviewed.   Removed IV, Site clean, dry and intact.  See LDA for information on wounds at discharge.  Patient states all belongings brought to the hospital at time of admission are accounted for and packed to take home.  Picked up medications from [facility] pharmacy.   [facility] contacted to transport patient to entrance A, where family member was waiting in vehicle to transport home.

## 2024-04-24 NOTE — Progress Notes (Signed)
 PHARMACIST LIPID MONITORING   Joseph Potts. is a 89 y.o. male admitted on 04/23/2024 with  asymptomatic carotid artery stenosis s/p R TCAR 04/23/24.  Pharmacy has been consulted to optimize lipid-lowering therapy with the indication of secondary prevention for clinical ASCVD.  Patient/Protocol Exclusion: No, patient meets the inclusion criteria for the lipid protocol.   Recent Labs:  Lipid Panel (last 6 months):   Lab Results  Component Value Date   CHOL 112 04/24/2024   TRIG 107 04/24/2024   HDL 33 (L) 04/24/2024   CHOLHDL 3.4 04/24/2024   VLDL 21 04/24/2024   LDLCALC 58 04/24/2024    Hepatic function panel (last 6 months):   Lab Results  Component Value Date   AST 24 04/15/2024   ALT 16 04/15/2024   ALKPHOS 101 04/15/2024   BILITOT 0.4 04/15/2024    SCr (since admission):   Serum creatinine: 1.15 mg/dL 98/69/73 9749 Estimated creatinine clearance: 46.4 mL/min  Current therapy and lipid therapy tolerance Prior to admission lipid-lowering therapy: omega 3 Previous lipid-lowering therapies (if applicable): pravastatin  - patient states he did not have any issues with this, does not recall why he stopped taking it but prefers not to be on statins  Documented or reported allergies or intolerances to lipid-lowering therapies (if applicable): none LDL goal : < 55  Plan:    1.Statin intensity (high intensity recommended for all patients regardless of the LDL):  patient prefers to not start a statin and look into red yeast rice   2.Add ezetimibe (if any one of the following):   Not indicated at this time.  3.Refer ([id number]) to [facility] pharmacist clinic:   No  4.Follow-up provider:   PCP - Orlie Norris, MD   5.Follow-up after discharge:  No changes in lipid therapy, repeat a lipid panel in one year  Jinnie Door, PharmD, BCPS, [facility] Clinical Pharmacist  Please check AMION for all [facility] Pharmacy phone numbers After 10:00 PM, call Main Pharmacy [phone]

## 2024-04-24 NOTE — Discharge Instructions (Signed)
